# Patient Record
Sex: Female | Born: 1945 | Race: White | Hispanic: No | Marital: Married | Smoking: Never smoker
Health system: Southern US, Community
[De-identification: ages and names within clinical notes are randomized; demographics above are authoritative.]

## PROBLEM LIST (undated history)

## (undated) DIAGNOSIS — N814 Uterovaginal prolapse, unspecified: Secondary | ICD-10-CM

## (undated) DIAGNOSIS — R011 Cardiac murmur, unspecified: Secondary | ICD-10-CM

## (undated) DIAGNOSIS — N200 Calculus of kidney: Secondary | ICD-10-CM

## (undated) DIAGNOSIS — B019 Varicella without complication: Secondary | ICD-10-CM

## (undated) DIAGNOSIS — H18513 Endothelial corneal dystrophy, bilateral: Secondary | ICD-10-CM

## (undated) DIAGNOSIS — J45909 Unspecified asthma, uncomplicated: Secondary | ICD-10-CM

## (undated) DIAGNOSIS — D649 Anemia, unspecified: Secondary | ICD-10-CM

## (undated) DIAGNOSIS — M810 Age-related osteoporosis without current pathological fracture: Secondary | ICD-10-CM

## (undated) DIAGNOSIS — T7840XA Allergy, unspecified, initial encounter: Secondary | ICD-10-CM

## (undated) DIAGNOSIS — N8110 Cystocele, unspecified: Secondary | ICD-10-CM

## (undated) HISTORY — DX: Cystocele, unspecified: N81.10

## (undated) HISTORY — DX: Unspecified asthma, uncomplicated: J45.909

## (undated) HISTORY — DX: Calculus of kidney: N20.0

## (undated) HISTORY — DX: Varicella without complication: B01.9

## (undated) HISTORY — DX: Uterovaginal prolapse, unspecified: N81.4

## (undated) HISTORY — DX: Age-related osteoporosis without current pathological fracture: M81.0

## (undated) HISTORY — DX: Anemia, unspecified: D64.9

## (undated) HISTORY — DX: Allergy, unspecified, initial encounter: T78.40XA

---
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
## 1968-01-11 HISTORY — PX: OTHER SURGICAL HISTORY: SHX169

## 2006-12-24 ENCOUNTER — Emergency Department (HOSPITAL_COMMUNITY): Admission: EM | Admit: 2006-12-24 | Discharge: 2006-12-24 | Payer: Self-pay | Admitting: Emergency Medicine

## 2007-01-11 DIAGNOSIS — N200 Calculus of kidney: Secondary | ICD-10-CM

## 2007-01-11 HISTORY — DX: Calculus of kidney: N20.0

## 2007-04-04 ENCOUNTER — Other Ambulatory Visit: Admission: RE | Admit: 2007-04-04 | Discharge: 2007-04-04 | Payer: Self-pay | Admitting: Family Medicine

## 2011-07-06 ENCOUNTER — Other Ambulatory Visit: Payer: Self-pay | Admitting: Family Medicine

## 2011-07-06 DIAGNOSIS — Z780 Asymptomatic menopausal state: Secondary | ICD-10-CM

## 2013-01-10 DIAGNOSIS — N8110 Cystocele, unspecified: Secondary | ICD-10-CM

## 2013-01-10 HISTORY — DX: Cystocele, unspecified: N81.10

## 2013-11-26 DIAGNOSIS — M810 Age-related osteoporosis without current pathological fracture: Secondary | ICD-10-CM

## 2013-11-26 HISTORY — DX: Age-related osteoporosis without current pathological fracture: M81.0

## 2014-02-18 ENCOUNTER — Ambulatory Visit: Payer: PPO | Attending: Family Medicine | Admitting: Physical Therapy

## 2014-02-18 DIAGNOSIS — N8189 Other female genital prolapse: Secondary | ICD-10-CM | POA: Insufficient documentation

## 2014-02-18 DIAGNOSIS — R1032 Left lower quadrant pain: Secondary | ICD-10-CM | POA: Diagnosis not present

## 2014-03-05 ENCOUNTER — Ambulatory Visit: Payer: PPO | Admitting: Physical Therapy

## 2014-03-12 ENCOUNTER — Ambulatory Visit: Payer: PPO | Attending: Family Medicine | Admitting: Physical Therapy

## 2014-03-12 ENCOUNTER — Encounter: Payer: Self-pay | Admitting: Physical Therapy

## 2014-03-12 DIAGNOSIS — R1032 Left lower quadrant pain: Secondary | ICD-10-CM | POA: Diagnosis not present

## 2014-03-12 DIAGNOSIS — M62838 Other muscle spasm: Secondary | ICD-10-CM

## 2014-03-12 DIAGNOSIS — M25552 Pain in left hip: Secondary | ICD-10-CM

## 2014-03-12 DIAGNOSIS — N8189 Other female genital prolapse: Secondary | ICD-10-CM | POA: Diagnosis present

## 2014-03-12 NOTE — Patient Instructions (Addendum)
Patient confirms identification and approves physical therapist to perform internal soft tissue work .  Physical therapist verbally instructed patient on not holding her breath while lifting or performing a hard task to reduce prolapse.  [facility] verbally [facility]  General Guidelines for Pelvic Floor Exercise  Challenge your muscles to do more than they are used to doing. The quality of the exercise is more important that the number you perform.  Avoid straining, holding your breath or using buttock or leg muscles while you exercise the pelvic floor muscles.  Count out loud and continue breathing to avoid straining.  Relax your body and breathe during your exercises.  Coordinate your breathing with your pelvic floor contraction by blowing out or exhaling while you contract your pelvic floor muscles.   Concentrate on activating both the sphincters and levator ani muscles of the pelvic floor with each exercise.  Position for the Exercises  Start lying down with your knees bent and supported with pillows.  Once you've gained awareness and can feel the contractions you may perform the exercises either sitting or standing.  For example, you can do them while driving, working on the computer, or waiting in lines.  Quick Contractions  Repeat this exercise 5 times.  Do the exercise3 times per day.  Rapidly contract your pelvic floor muscles and hold for 2 seconds relax for 2 seconds.  Try to do the contraction on breathing exhalation.  Endurance Contractions  Repeat this 5 times.  Do the exercise 5 times per day.  Pull your pelvic floor muscles up and in and hold for 5 seconds then relax for 10 seconds.  Count out loud while you are holding the contraction to make sure that you are breathing throughout the exercise and not straining.  Other Exercises/Instructions Perform in laying downPiriformis Stretch, Sitting   Sit, one ankle on opposite knee, same-side hand on  crossed knee. Push down on knee, keeping spine straight. Lean torso forward, with flat back, until tension is felt in hamstrings and gluteals of crossed-leg side. Hold _30__ seconds.  Repeat _2__ times per session. Do __2_ sessions per day.  Copyright  VHI. All rights reserved.     2007, [employer] Doc.37 Adductor Stretch - Supine   Lie on floor, knees bent, feet flat. Keeping feet together, lower knees toward floor until stretch felt at inner thighs. Repeat _2__ times. Do __2_ times per day. Hold 30 secondsBACK: Hip Flexor Stretch   Interlace fingers on top of right knee. Shift weight forward. Continue breathing normally and hold position for _15seconds. Repeat on other leg. Alternate sides _2__ times. Do 2___ times per day.  Copyright  VHI. All rights reserved.                                              Interlace fingers on top of right knee. Shift weight forward. Continue breathing normally and hold position for ___ breaths. Repeat on other leg. Alternate sides ___ times. Do ___ times per day.  Copyright  VHI. All rights reserved.    Copyright  VHI. All rights reserved.    Physical therapist instructed patient on where to get a vaginal massager so she can perform self soft tissue work at home.

## 2014-03-12 NOTE — Therapy (Signed)
[facility] [address] [place], [place], [address] Phone: [phone]   Fax:  [phone]  Physical Therapy Treatment  Patient Details  Name: Raven Buchanan MRN: [id number] Date of Birth: 1945-08-18 Referring Provider:  Cain SaupeFulp, Cammie, MD  Encounter Date: 03/12/2014      PT End of Session - 03/12/14 0955    Visit Number 2   Date for PT Re-Evaluation 04/15/14   PT Start Time 0855   PT Stop Time 0956   PT Time Calculation (min) 61 min   Activity Tolerance Patient tolerated treatment well   Behavior During Therapy [facility] for tasks assessed/performed      History reviewed. No pertinent past medical history.  History reviewed. No pertinent past surgical history.  There were no vitals taken for this visit.  Visit Diagnosis:  Pain in joint, pelvic region and thigh, left  Muscle spasm      Subjective Assessment - 03/12/14 0859    Symptoms Patient bladder is dropping. Pain in left quadrant. wakes up 3 times with sleeping   Limitations Other (comment)  increased vaginal pressure with activities.  difficutly  with dancing   Patient Stated Goals increase stamina and strength of the pelvic floor and left hip   Currently in Pain? Yes   Pain Score 8   laying in bed 8/10, daily activities 1/10   Pain Location Groin  left inner thigh, left psoas   Pain Orientation Left   Pain Descriptors / Indicators Sharp;Dull   Pain Type Chronic pain   Pain Onset More than a month ago   Pain Frequency Intermittent   Aggravating Factors  laying down at night   Pain Relieving Factors change position   Effect of Pain on Daily Activities difficulty laying down, not able to dance   Multiple Pain Sites No                  Pelvic Floor Special Questions - 03/12/14 0001    Exam Type Vaginal   Strength fair squeeze, definite lift   Strength # of reps 5   Strength # of seconds 5          [facility] Adult PT Treatment/Exercise -  03/12/14 0001    Manual Therapy   Manual Therapy Massage;Myofascial release;Internal Pelvic Floor   Myofascial Release to lower abdominal, psoas, iliotibial band,left part of diaphragm, left quadricep, left hip addutor   Internal Pelvic Floor left obturator internist, left levator ane                PT Education - 03/12/14 0955    Education provided Yes   Education Details pelvic floor exercise, stretches   Person(s) Educated Patient   Methods Explanation;Demonstration;Tactile cues;Verbal cues;Handout   Comprehension Verbalized understanding;Returned demonstration          PT Short Term Goals - 03/12/14 0855    PT SHORT TERM GOAL #1   Title be independent with initial HEP for pelvic floor contraction in supine   Time 4   Period Weeks   Status New   PT SHORT TERM GOAL #2   Title how to lay with pillow under hips to reduce pelvic pressure   Time 4   Period Weeks   Status New   PT SHORT TERM GOAL #3   Title how to perform a valsalva maneurver to decreased prolapse   Time 4   Period Weeks   Status New   PT SHORT TERM GOAL #4  Title Toileting technique to reduce strain on prolapse with bowel movement   Time 4   Period Weeks   Status New           PT Long Term Goals - 03/12/14 1610    PT LONG TERM GOAL #1   Title demonstrate and/or verbalize techniques to reduce the risk of re-injury to include info on posture, body mechanics, lifting.   Time 8   Period Weeks   Status New   PT LONG TERM GOAL #2   Title be independent with advanced HEP for pelvic floor strengthening   Time 8   Period Weeks   Status New   PT LONG TERM GOAL #3   Title Pelvic floor strength >/= 4/5 with urinary leakage decreased >/= 60%   Time 8   Period Weeks   Status New   PT LONG TERM GOAL #4   Title to return to her dancing due to prolapse reduced with pessary   Time 8   Period Weeks   Status New   PT LONG TERM GOAL #5   Title sleep through the night 3 nights out of the week    Time 8   Period Weeks   Status New   Additional Long Term Goals   Additional Long Term Goals Yes   PT LONG TERM GOAL #6   Title left lower abdominal pain reduced >/= 60% with hip flexion    Time 8   Period Weeks   Status New               Plan - 03/12/14 9604    Clinical Impression Statement Patient has trigger points in left pelvic floor muscle, left quads, left hip adductor, and left abdominal wall.  Patient    Pt will benefit from skilled therapeutic intervention in order to improve on the following deficits Impaired flexibility;Postural dysfunction;Decreased endurance;Decreased activity tolerance;Increased fascial restricitons;Pain;Increased muscle spasms;Decreased strength;Decreased mobility   Rehab Potential Good   Clinical Impairments Affecting Rehab Potential None   PT Frequency 1x / week   PT Duration 8 weeks   PT Treatment/Interventions Passive range of motion;Therapeutic exercise;Moist Heat;Therapeutic activities;Patient/family education;Manual techniques;Ultrasound;Biofeedback;Neuromuscular re-education;Functional mobility training;Electrical Stimulation;Cryotherapy   PT Next Visit Plan go over gym exercises, toileting technique, soft tissue work   PT Home Exercise Plan toileting technique, self soft tissue work to pelvic floor muscles   Consulted and Agree with Plan of Care --       Problem List There are no active problems to display for this patient.   Liesl Simons, PT 03/12/2014, 10:10 AM  [facility] [address], [address] [place], [place], [address] Phone: [phone]   Fax:  [phone]

## 2014-03-19 ENCOUNTER — Encounter: Payer: Self-pay | Admitting: Physical Therapy

## 2014-03-19 ENCOUNTER — Ambulatory Visit: Payer: PPO | Admitting: Physical Therapy

## 2014-03-19 DIAGNOSIS — M62838 Other muscle spasm: Secondary | ICD-10-CM

## 2014-03-19 DIAGNOSIS — N8189 Other female genital prolapse: Secondary | ICD-10-CM | POA: Diagnosis not present

## 2014-03-19 NOTE — Therapy (Signed)
[facility] [address], [address] [place], [place], [address] Phone: [phone]   Fax:  [phone]  Physical Therapy Treatment  Patient Details  Name: Raven Spieslaine M Buchanan MRN: [id number] Date of Birth: 07-27-1945 Referring Provider:  Cain SaupeFulp, Cammie, MD  Encounter Date: 03/19/2014      PT End of Session - 03/19/14 0949    Visit Number 3   Date for PT Re-Evaluation 04/15/14   PT Start Time 0845   PT Stop Time 0945   PT Time Calculation (min) 60 min   Activity Tolerance Patient tolerated treatment well   Behavior During Therapy [facility] for tasks assessed/performed      History reviewed. No pertinent past medical history.  History reviewed. No pertinent past surgical history.  There were no vitals taken for this visit.  Visit Diagnosis:  Muscle spasm      Subjective Assessment - 03/19/14 0905    Symptoms Patient reports pain is 70% better.  Patient feels the bladder is not dropping as much.    Limitations Other (comment)   Patient Stated Goals increase stamina and strength of the pelvic floor and left hip   Currently in Pain? 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  No   Is the patient reluctant to leave their home because  of a fear of falling?  No   Prior Function   Level of Independence Independent with basic ADLs   Observation/Other Assessments   Other Surveys  --  PFDI -20 4 points   Palpation   Palpation Pelvis in correct alignment                  [facility] Adult PT Treatment/Exercise - 03/19/14 0001    Manual Therapy   Manual Therapy Massage;Myofascial release   Myofascial Release to lower abdominal area, bladder, diaphragm, along the intestines, bilateral psoas                PT Education - 03/19/14 0935    Education provided Yes   Education Details toileting technique, abdominal massage, diaphragmatic breathing, dancing moves   Person(s) Educated Patient   Methods Explanation;Demonstration;Verbal cues;Handout   Comprehension Verbalized understanding;Returned demonstration          PT Short Term Goals - 03/19/14 [id number]    PT SHORT TERM GOAL #1   Title be independent with initial HEP for pelvic floor contraction in supine   Time 4   Period Weeks   Status Achieved   PT SHORT TERM GOAL #2   Title how to lay with pillow under hips to reduce pelvic  pressure   Period Weeks   Status Achieved   PT SHORT TERM GOAL #3   Title how to perform a valsalva maneurver to decreased prolapse   Time 4   Period Weeks   Status New   PT SHORT TERM GOAL #4   Title Toileting technique to reduce strain on prolapse with bowel movement   Time 4   Period Weeks   Status Achieved           PT Long Term Goals - 03/19/14 1191    PT LONG TERM GOAL #1   Title demonstrate and/or verbalize techniques to reduce the risk of re-injury to include info on posture, body mechanics, lifting.   Time 8   Period Weeks   Status On-going  continue to educated   PT LONG TERM GOAL #2   Title be independent with advanced HEP for pelvic floor strengthening   Time 8   Period Weeks   Status On-going  continue to learn exercises   PT LONG TERM GOAL #3   Title Pelvic floor strength >/= 4/5 with urinary  leakage decreased >/= 60%   Time 8   Period Weeks   Status On-going  still weak   PT LONG TERM GOAL #4   Title to return to her dancing due to prolapse reduced with pessary   Time 8   Period Weeks   Status On-going  will be dancing in several weeks   PT LONG TERM GOAL #5   Title sleep through the night 3 nights out of the week   Time 8   Period Weeks   Status On-going  continues to wake up   PT LONG TERM GOAL #6   Title left lower abdominal pain reduced >/= 60% with hip flexion    Time 8   Period Weeks   Status Achieved               Plan - 03/19/14 0949    Clinical Impression Statement Patient has less restrictions in the left diaphragm compared to last visit.  Restriction in the bladder region and bilateral psoas.  After therapy, patient felt more aligned.    Pt will benefit from skilled therapeutic intervention in order to improve on the following deficits Impaired flexibility;Postural dysfunction;Decreased endurance;Decreased activity tolerance;Increased fascial restricitons;Pain;Increased muscle spasms;Decreased strength;Decreased mobility   Rehab Potential Good   Clinical Impairments Affecting Rehab Potential None   PT Frequency 1x / week   PT Duration 8 weeks   PT Treatment/Interventions Passive range of motion;Therapeutic exercise;Moist Heat;Therapeutic activities;Patient/family education;Manual techniques;Ultrasound;Biofeedback;Neuromuscular re-education;Functional mobility training;Electrical Stimulation;Cryotherapy   PT Next Visit Plan review gym and dance exercises with pelvic floor contraction, soft tissue work, and instruction on how to use vaginal massager   PT Home Exercise Plan pelvic floor exercise in standing, lift with valsalva manuever   Recommended Other Services None   Consulted and Agree with Plan of Care Patient        Problem List There are no active problems to display for this patient.   Raven Buchanan, PT 03/19/2014, 9:57 AM  [facility] [address], [address] [place], [place], [address] Phone: [phone]   Fax:  [phone]

## 2014-03-19 NOTE — Patient Instructions (Addendum)
Physical therapist discussed with patient on what vaginal massager will work, what it looks like and where to purchase. Patient verbally understands.    Toileting Techniques for Bowel Movements (Defecation)  Using your belly (abdomen) and pelvic floor muscles to have a bowel movement is usually instinctive.  Sometimes people can have problems with these muscles and have to relearn proper defecation (emptying) techniques.  If you have weakness in your muscles, organs that are falling out, decreased sensation in your pelvis, or ignore your urge to go, you may find yourself straining to have a bowel movement.  You are straining if you are:  . holding your breath or taking in a huge gulp of air and holding it  . keeping your lips and jaw tensed and closed tightly . turning red in the face because of excessive pushing or forcing . developing or worsening your  hemorrhoids . getting faint while pushing . not emptying completely and have to defecate many times a day  If you are straining, you are actually making it harder for yourself to have a bowel movement.  Many people find they are pulling up with the pelvic floor muscles and closing off instead of opening the anus. Due to lack pelvic floor relaxation and coordination the abdominal muscles, one has to work harder to push the feces out.  Many people have never been taught how to defecate efficiently and effectively.  Notice what happens to your body when you are having a bowel movement.  While you are sitting on the toilet pay attention to the following areas: . Jaw and mouth position . Angle of your hips   . Whether your feet touch the ground or not . Arm placement . Spine position . Waist . Belly tension . Anus (opening of the anal canal)  An Evacuation/Defecation Plan   Here are the 4 basic points:  1. Lean forward enough for your elbows to rest on your knees 2. Support your feet on the floor or use a low stool if your feet don't touch  the floor  3. Push out your belly as if you have swallowed a beach ball-you should feel a widening of your waist 4. Open and relax your pelvic floor muscles, rather than tightening around the anus   The following conditions my require modifications to your toileting posture:  . If you have had surgery in the past that limits your back, hip, pelvic, knee or ankle flexibility . Constipation   Your healthcare practitioner may make the following additional suggestions and adjustments:  1) Sit on the toilet  a) Make sure your feet are supported. b) Notice your hip angle and spine position-most people find it effective to lean forward or raise their knees, which can help the muscles around the anus to relax  c) When you lean forward, place your forearms on your thighs for support  2) Relax suggestions a) Breath deeply in through your nose and out slowly through your mouth as if you are smelling the flowers and blowing out the candles. b) To become aware of how to relax your muscles, contracting and releasing muscles can be helpful.  Pull your pelvic floor muscles in tightly by using the image of holding back gas, or closing around the anus (visualize making a circle smaller) and lifting the anus up and in.  Then release the muscles and your anus should drop down and feel open. Repeat 5 times ending with the feeling of relaxation. c) Keep your pelvic floor  muscles relaxed; let your belly bulge out. d) The digestive tract starts at the mouth and ends at the anal opening, so be sure to relax both ends of the tube.  Place your tongue on the roof of your mouth with your teeth separated.  This helps relax your mouth and will help to relax the anus at the same time.  3) Empty (defecation) a) Keep your pelvic floor and sphincter relaxed, then bulge your anal muscles.  Make the anal opening wide.  b) Stick your belly out as if you have swallowed a beach ball. c) Make your belly wall hard using your belly  muscles while continuing to breathe. Doing this makes it easier to open your anus. d) Breath out and give a grunt (or try using other sounds such as ahhhh, shhhhh, ohhhh or grrrrrrr).  4) Finish a) As you finish your bowel movement, pull the pelvic floor muscles up and in.  This will leave your anus in the proper place rather than remaining pushed out and down. If you leave your anus pushed out and down, it will start to feel as though that is normal and give you incorrect signals about needing to have a bowel movement.   2007, [employer] Doc.23  About Abdominal Massage  Abdominal massage, also called external colon massage, is a self-treatment circular massage technique that can reduce and eliminate gas and ease constipation. The colon naturally contracts in waves in a clockwise direction starting from inside the right hip, moving up toward the ribs, across the belly, and down inside the left hip.  When you perform circular abdominal massage, you help stimulate your colon's normal wave pattern of movement called peristalsis.  It is most beneficial when done after eating.  Positioning You can practice abdominal massage with oil while lying down, or in the shower with soap.  Some people find that it is just as effective to do the massage through clothing while sitting or standing.  How to Massage Start by placing your finger tips or knuckles on your right side, just inside your hip bone.  . Make small circular movements while you move upward toward your rib cage.   . Once you reach the bottom right side of your rib cage, take your circular movements across to the left side of the bottom of your rib cage.  . Next, move downward until you reach the inside of your left hip bone.  This is the path your feces travel in your colon. . Continue to perform your abdominal massage in this pattern for 10 minutes each day.     You can apply as much pressure as is comfortable in your massage.   Start gently and build pressure as you continue to practice.  Notice any areas of pain as you massage; areas of slight pain may be relieved as you massage, but if you have areas of significant or intense pain, consult with your healthcare provider.  Other Considerations . General physical activity including bending and stretching can have a beneficial massage-like effect on the colon.  Deep breathing can also stimulate the colon because breathing deeply activates the same nervous system that supplies the colon.   . Abdominal massage should always be used in combination with a bowel-conscious diet that is high in the proper type of fiber for you, fluids (primarily water), and a regular exercise program.  Hook-Lying   Lie with hips and knees bent. Allow body's muscles to relax. Place hands on belly. Inhale slowly  and deeply for _3__ seconds, so hands move up. Hold 5 seconds. Then take _3__ seconds to exhale. Repeat _5__ times. Do _2__ times a day.   Copyright  VHI. All rights reserved.  Physical therapist instructed patient on hip abduction and extension in standing with pelvic floor exercise to assist her to return to dancing. Patient able to return demonstration correctly correctly.

## 2014-03-26 ENCOUNTER — Ambulatory Visit: Payer: PPO | Admitting: Physical Therapy

## 2014-03-26 ENCOUNTER — Encounter: Payer: Self-pay | Admitting: Physical Therapy

## 2014-03-26 DIAGNOSIS — M62838 Other muscle spasm: Secondary | ICD-10-CM

## 2014-03-26 DIAGNOSIS — N8189 Other female genital prolapse: Secondary | ICD-10-CM | POA: Diagnosis not present

## 2014-03-26 DIAGNOSIS — M25552 Pain in left hip: Secondary | ICD-10-CM

## 2014-03-26 NOTE — Therapy (Signed)
[facility] [address], [address] [place], [place], [address] Phone: [phone]   Fax:  [phone]  Physical Therapy Treatment  Patient Details  Name: Raven Buchanan MRN: [id number] Date of Birth: [birth year] Referring Provider:  Cain SaupeFulp, Cammie, MD  Encounter Date: 03/26/2014      PT End of Session - 03/26/14 0931    Visit Number 4   Date for PT Re-Evaluation 04/15/14   PT Start Time 0845   PT Stop Time 0950   PT Time Calculation (min) 65 min   Activity Tolerance --   Behavior During Therapy [facility] for tasks assessed/performed      History reviewed. No pertinent past medical history.  History reviewed. No pertinent past surgical history.  There were no vitals filed for this visit.  Visit Diagnosis:  Pain in joint, pelvic region and thigh, left  Muscle spasm      Subjective Assessment - 03/26/14 0858    Symptoms Patient is not wearing the pessary intermittently.   Bladder has not dropped outside the vagina. Balance is not as good when standing on left leg.    Limitations Other (comment)   Patient Stated Goals increase stamina and strength of the pelvic floor and left hip   Currently in Pain? Yes   Pain Score 3    Pain Location Groin   Pain Orientation Left   Pain Descriptors / Indicators Sharp   Pain Type Chronic pain   Pain Onset More than a month ago   Pain Frequency Intermittent   Aggravating Factors  laying flat, stand and ER left hip, vacuuming over carpet   Pain Relieving Factors change position   Effect of Pain on Daily Activities difficulty with laying down and dancing   Multiple Pain Sites No                       [facility] Adult PT Treatment/Exercise - 03/26/14 0001    Modalities   Modalities Ultrasound   Ultrasound   Ultrasound Location left lower abdominal   Ultrasound Parameters 100%, 8 min, 1 mHz, 1.2 w/cm2   Ultrasound Goals Pain   Manual Therapy   Manual Therapy  Massage;Myofascial release   Massage soft tissue work to left psoas and abdominals   Myofascial Release lower abdominals and obturator internits     Patient was educated on how to use pelvic floor massager in a reclined position with a mirror and gloves, and vaginal massager.  Patient understands how to perform independently and was able to return demonstration correctly.            PT Education - 03/26/14 0958    Education provided Yes   Education Details balance exercises   Person(s) Educated Patient   Methods Explanation;Demonstration;Handout   Comprehension Verbalized understanding;Returned demonstration          PT Short Term Goals - 03/26/14 0915    PT SHORT TERM GOAL #3   Title how to perform a valsalva maneurver to decreased prolapse   Time 4   Period Weeks   Status Achieved           PT Long Term Goals - 03/26/14 0915    PT LONG TERM GOAL #1   Title demonstrate and/or verbalize techniques to reduce the risk of re-injury to include info on posture, body mechanics, lifting.   Time 8   PT LONG TERM GOAL #2   Title be independent with advanced HEP for pelvic floor strengthening  Time 8   Period Weeks   Status On-going  continues to learn exercises   PT LONG TERM GOAL #3   Title Pelvic floor strength >/= 4/5 with urinary leakage decreased >/= 60%   Time 8   Period Weeks   Status On-going  urinary leakage improved 80%. Pessary 1 time ,.   PT LONG TERM GOAL #4   Title to return to her dancing due to prolapse reduced with pessary   Time 8   Period Weeks   Status On-going  pain with dancing   PT LONG TERM GOAL #5   Title sleep through the night 3 nights out of the week   Time 8   Period Weeks   Status Achieved  does not have to get up to go to the bathroom               Plan - 03/26/14 0959    Clinical Impression Statement Patient has tenderness located in left psoas.  Patient has difficulty with standing on left leg due to trunk and hip  weakness. Patient is able to perform self soft tissue work to pelvic floor using a pelvic floor massager.    Pt will benefit from skilled therapeutic intervention in order to improve on the following deficits Impaired flexibility;Postural dysfunction;Decreased endurance;Decreased activity tolerance;Increased fascial restricitons;Pain;Increased muscle spasms;Decreased strength;Decreased mobility   Rehab Potential Good   Clinical Impairments Affecting Rehab Potential None   PT Frequency 1x / week   PT Duration 8 weeks   PT Treatment/Interventions Passive range of motion;Therapeutic exercise;Moist Heat;Therapeutic activities;Patient/family education;Manual techniques;Ultrasound;Biofeedback;Neuromuscular re-education;Functional mobility training;Electrical Stimulation;Cryotherapy   PT Next Visit Plan ultrasound, soft tissue work, balance exercises   PT Home Exercise Plan single leg stance exercises   Recommended Other Services None   Consulted and Agree with Plan of Care Patient        Problem List There are no active problems to display for this patient.   Rolf Fells, PT 03/26/2014, 10:04 AM  [facility] [address] [place], [place], [address] Phone: [phone]   Fax:  [phone]

## 2014-03-26 NOTE — Patient Instructions (Addendum)
Pelvic floor massage    Put lubricant on vaginal massager.  Place inside vaginal directed toward the hip.  Rub the massager side for 3 minutes.  If you find a tender point hold for 1 min.  Then do other side of pelvic floor for 3 minutes.  Perform daily.   Swing: Anterior / Posterior   Hold onto counter. Standing on left leg, swing other leg forward and backward, under control, _10___ times. Repeat with other leg. Do _2___ sets per session. Do __3__ sessions per week.  http://bt.exer.us/60   Copyright  VHI. All rights reserved.  Hip Flexion With Rotation   Hold counter. Standing on one leg, other thigh parallel to floor, slowly rotate raised leg from side to side. Hold each position _1___ seconds. Repeat on other leg. Do __10__ repetitions, _1___ sets.  http://bt.exer.us/42   Copyright  VHI. All rights reserved.  Without Support   Stand on one leg in neutral spine without support. Hold _15___ seconds. Repeat on other leg. Do _3___ repetitions, __1__ sets. Then close eyes hold 15 sec. 3 times.  Then on other left.   http://bt.exer.us/36   Copyright  VHI. All rights reserved.  Patient able to return demonstration on above exercises

## 2014-04-02 ENCOUNTER — Ambulatory Visit: Payer: PPO | Admitting: Physical Therapy

## 2014-04-02 DIAGNOSIS — M25552 Pain in left hip: Secondary | ICD-10-CM

## 2014-04-02 DIAGNOSIS — N8189 Other female genital prolapse: Secondary | ICD-10-CM | POA: Diagnosis not present

## 2014-04-02 DIAGNOSIS — M62838 Other muscle spasm: Secondary | ICD-10-CM

## 2014-04-02 NOTE — Patient Instructions (Addendum)
Patient was instructed to wear her pessary during dancing.  Bracing With Forward Lunge (Standing)   Stand with hands on hips. Find neutral spine. Tighten pelvic floor and abdominals and hold. Step forward and bend knee to lower trunk.Hold 5 sec.  Repeat _3__ times. Do __1_ times a day. Perform both ways.  Copyright  VHI. All rights reserved.  Walk forward with blue band around waist and attached to door 5 x the backwards 5 x.  Do not let hips drop and pull up pelvic floor. 1 time per day.   When performing pelvic floor exercises use the vaginal massager to contract around and hold 5 seconds with correct breathing.  Patient able to perform the above exercises correctly.

## 2014-04-02 NOTE — Therapy (Signed)
[facility] [address], [address] [place], [place], [address] Phone: [phone]   Fax:  [phone]  Physical Therapy Treatment  Patient Details  Name: Raven Buchanan M Dolley MRN: [id number] Date of Birth: 1945-06-18 Referring Provider:  Cain SaupeFulp, Cammie, MD  Encounter Date: 04/02/2014      PT End of Session - 04/02/14 0928    Visit Number 5   Date for PT Re-Evaluation 06/10/14   PT Start Time 0845   PT Stop Time 0930   PT Time Calculation (min) 45 min   Activity Tolerance Patient tolerated treatment well   Behavior During Therapy [facility] for tasks assessed/performed      No past medical history on file.  No past surgical history on file.  There were no vitals filed for this visit.  Visit Diagnosis:  Pain in joint, pelvic region and thigh, left - Plan: PT plan of care cert/re-cert  Muscle spasm - Plan: PT plan of care cert/re-cert      Subjective Assessment - 04/02/14 0850    Symptoms When i do abdominal exercises, I get pulling in left side. Pulling is 50% better. I feel my balance is better. Somedays the pessary slides in and other days I have difficulty.  Did not feel the bladder be irritated without the pessary. compared to 10AM.    Limitations Other (comment)   How long can you sit comfortably? No difficulty    How long can you stand comfortably? In afternoon when she stands when feels the bladder dropping.    How long can you walk comfortably? In the afternoon when she walks for the bladder dropping.    Patient Stated Goals increase stamina and strength of the pelvic floor and left hip   Pain Score 4    Pain Location Groin   Pain Orientation Left   Pain Descriptors / Indicators Dull;Sharp   Pain Type Chronic pain   Pain Onset More than a month ago   Pain Frequency Intermittent   Aggravating Factors  lay flat wtih hip flexion.   Pain Relieving Factors change position   Effect of Pain on Daily Activities dancing    Multiple Pain Sites No                    Pelvic Floor Special Questions - 04/02/14 0001    Pelvic Floor Internal Exam Pt. confirmed identification and approved therapist to assess strength   Exam Type Vaginal   Strength --  4-/5 with verbal cue to breathe and lift pelvic floor   Strength # of reps 4   Strength # of seconds 3   Tone improved tone of pelvic floor       Patient is able to abolish her left groin pain when not dropping her left hip while walking with resistance due to working her core. Patient requires verbal cues to lift her pelvic floor correctly with breathing.             PT Education - 04/02/14 0913    Education provided Yes   Education Details walk with resistance, lunge   Person(s) Educated Patient   Methods Explanation;Demonstration;Verbal cues   Comprehension Returned demonstration;Verbalized understanding          PT Short Term Goals - 03/26/14 0915    PT SHORT TERM GOAL #3   Title how to perform a valsalva maneurver to decreased prolapse   Time 4   Period Weeks   Status Achieved  PT Long Term Goals - 04/02/14 0855    PT LONG TERM GOAL #1   Title demonstrate and/or verbalize techniques to reduce the risk of re-injury to include info on posture, body mechanics, lifting.   Time 8   Period Weeks   Status Achieved   PT LONG TERM GOAL #2   Title be independent with advanced HEP for pelvic floor strengthening   Time 8   Period Weeks   Status On-going  continues to progress exercises   PT LONG TERM GOAL #4   Title to return to her dancing due to prolapse reduced with pessary   Time 8   Period Weeks   Status On-going  has not fully returned to dancing   PT LONG TERM GOAL #5   Title sleep through the night 3 nights out of the week   Time 8   Period Weeks   Status Achieved   PT LONG TERM GOAL #6   Title left lower abdominal pain reduced >/= 60% with hip flexion    Time 8   Period Weeks   Status Achieved                Plan - 04/02/14 8119    Clinical Impression Statement Patient continues to have tenderness located in left psoas area.  Pelvic floor strength has increased.  When resistive walking with have a trendelenberg on the right. Patient reports no urinary leakage.    Pt will benefit from skilled therapeutic intervention in order to improve on the following deficits Impaired flexibility;Postural dysfunction;Decreased endurance;Decreased activity tolerance;Increased fascial restricitons;Pain;Increased muscle spasms;Decreased strength;Decreased mobility   Rehab Potential Good   Clinical Impairments Affecting Rehab Potential None   PT Frequency 1x / week   PT Duration 8 weeks   PT Treatment/Interventions Passive range of motion;Therapeutic exercise;Moist Heat;Therapeutic activities;Patient/family education;Manual techniques;Ultrasound;Biofeedback;Neuromuscular re-education;Functional mobility training;Electrical Stimulation;Cryotherapy   PT Next Visit Plan ultrasound, soft tissue work, see how dancing went.   PT Home Exercise Plan review HEP   Recommended Other Services None   Consulted and Agree with Plan of Care Patient        Problem List There are no active problems to display for this patient.   Kaige Whistler,PT 04/02/2014, 9:34 AM  [facility] [address], [address] [place], [place], [address] Phone: [phone]   Fax:  [phone]

## 2014-04-14 ENCOUNTER — Encounter: Payer: Self-pay | Admitting: Physical Therapy

## 2014-04-14 ENCOUNTER — Ambulatory Visit: Payer: PPO | Attending: Family Medicine | Admitting: Physical Therapy

## 2014-04-14 DIAGNOSIS — R1032 Left lower quadrant pain: Secondary | ICD-10-CM | POA: Diagnosis not present

## 2014-04-14 DIAGNOSIS — N8189 Other female genital prolapse: Secondary | ICD-10-CM | POA: Diagnosis present

## 2014-04-14 DIAGNOSIS — M25552 Pain in left hip: Secondary | ICD-10-CM

## 2014-04-14 DIAGNOSIS — M62838 Other muscle spasm: Secondary | ICD-10-CM

## 2014-04-15 NOTE — Therapy (Signed)
[facility] [address] [place], [place], [address] Phone: [phone]   Fax:  [phone]  Physical Therapy Treatment  Patient Details  Name: Raven Buchanan MRN: [id number] Date of Birth: [birth year] Referring Provider:  Cain Saupe, MD  Encounter Date: 04/14/2014      PT End of Session - 04/14/14 1634    Visit Number 6   Date for PT Re-Evaluation 06/10/14   PT Start Time 1630   PT Stop Time 1700   PT Time Calculation (min) 30 min   Activity Tolerance Patient tolerated treatment well   Behavior During Therapy [facility] for tasks assessed/performed      History reviewed. No pertinent past medical history.  History reviewed. No pertinent past surgical history.  There were no vitals filed for this visit.  Visit Diagnosis:  Pain in joint, pelvic region and thigh, left  Muscle spasm      Subjective Assessment - 04/14/14 1628    Subjective When I lay with my legs straight it is harder to contract pelvic floor then when my legs are bent. When patient contracts pelvic floor and lower abdomen she has less pain. Patient reports it takes more hours for the bladder to dropping and does not come out of the introitus.  Patient reports left sided pain has decresaed by 50%.    Limitations Other (comment)   How long can you sit comfortably? No difficulty    How long can you stand comfortably? In afternoon when she stands when feels the bladder dropping.    How long can you walk comfortably? In the afternoon when she walks for the bladder dropping.    Patient Stated Goals increase stamina and strength of the pelvic floor and left hip   Currently in Pain? Yes   Pain Score 2    Pain Location Groin   Pain Orientation Left   Pain Descriptors / Indicators Tightness   Pain Type Chronic pain   Pain Onset More than a month ago   Pain Frequency Intermittent   Aggravating Factors  Not sure   Pain Relieving Factors massage with  vibrating   Multiple Pain Sites No                       [facility] Adult PT Treatment/Exercise - 04/14/14 0001    Modalities   Modalities Ultrasound   Ultrasound   Ultrasound Location left lower abdomen   Ultrasound Parameters 100%, 8 min, 1.2w/cm2, 1 mhz   Ultrasound Goals Pain   Manual Therapy   Manual Therapy Massage;Myofascial release   Massage soft tissue work to left psoas and abdominals   Myofascial Release lower abdominals and obturator internits                  PT Short Term Goals - 03/26/14 0915    PT SHORT TERM GOAL #3   Title how to perform a valsalva maneurver to decreased prolapse   Time 4   Period Weeks   Status Achieved           PT Long Term Goals - 04/14/14 1635    PT LONG TERM GOAL #2   Title be independent with advanced HEP for pelvic floor strengthening   Time 8   Period Weeks   Status On-going   PT LONG TERM GOAL #3   Title Pelvic floor strength >/= 4/5 with urinary leakage decreased >/= 60%   Time 8   Period Weeks  Status On-going   PT LONG TERM GOAL #4   Title to return to her dancing due to prolapse reduced with pessary   Time 8   Period Weeks   Status On-going  not jumping with dance yet               Plan - 04/14/14 1655    Clinical Impression Statement Patient has tender  areas in left iliopsoas.  Patient has better balance with dance moves.  Patient is getting stronger and understands ways to accommodate to her pain.    Pt will benefit from skilled therapeutic intervention in order to improve on the following deficits Impaired flexibility;Postural dysfunction;Decreased endurance;Decreased activity tolerance;Increased fascial restricitons;Pain;Increased muscle spasms;Decreased strength;Decreased mobility   Rehab Potential Good   PT Frequency 1x / week   PT Duration 2 weeks   PT Treatment/Interventions Passive range of motion;Therapeutic exercise;Moist Heat;Therapeutic activities;Patient/family  education;Manual techniques;Ultrasound;Biofeedback;Neuromuscular re-education;Functional mobility training;Electrical Stimulation;Cryotherapy   PT Next Visit Plan jumping on trampoline, ultrasound, soft tissue work   PT Home Exercise Plan review HEP   Consulted and Agree with Plan of Care Patient        Problem List There are no active problems to display for this patient.   GRAY,CHERYL,PT 04/15/2014, 7:53 AM  [facility] [address], [address] [place], [place], [address] Phone: [phone]   Fax:  [phone]

## 2014-04-23 ENCOUNTER — Encounter: Payer: Self-pay | Admitting: Physical Therapy

## 2014-04-23 ENCOUNTER — Ambulatory Visit: Payer: PPO | Admitting: Physical Therapy

## 2014-04-23 DIAGNOSIS — N8189 Other female genital prolapse: Secondary | ICD-10-CM | POA: Diagnosis not present

## 2014-04-23 DIAGNOSIS — M62838 Other muscle spasm: Secondary | ICD-10-CM

## 2014-04-23 NOTE — Therapy (Signed)
[facility] [address], [address] [place], [place], [address] Phone: [phone]   Fax:  [phone]  Physical Therapy Treatment  Patient Details  Name: Raven Buchanan MRN: [id number] Date of Birth: 03/27/1945 Referring Provider:  Cain SaupeFulp, Cammie, MD  Encounter Date: 04/23/2014      PT End of Session - 04/23/14 0936    Visit Number 7   Number of Visits 10  Medicare   Date for PT Re-Evaluation 06/10/14   PT Start Time 0930   PT Stop Time 1015   PT Time Calculation (min) 45 min   Activity Tolerance Patient tolerated treatment well   Behavior During Therapy [facility] for tasks assessed/performed      History reviewed. No pertinent past medical history.  History reviewed. No pertinent past surgical history.  There were no vitals filed for this visit.  Visit Diagnosis:  Muscle spasm      Subjective Assessment - 04/23/14 0937    Subjective The left side pain is better. When I massage internally the right side feels tighter than the left.  No change in prolapse.  Patient reports her balance is better. No leakage with pessary now.    Limitations Other (comment)   How long can you sit comfortably? No difficulty    How long can you stand comfortably? In afternoon when she stands when feels the bladder dropping.    How long can you walk comfortably? In the afternoon when she walks for the bladder dropping.    Patient Stated Goals increase stamina and strength of the pelvic floor and left hip   Currently in Pain? Yes   Pain Score 2    Pain Location Groin   Pain Orientation Left   Pain Descriptors / Indicators Tightness   Pain Type Chronic pain   Pain Onset More than a month ago   Pain Frequency Intermittent   Aggravating Factors  Not sure   Pain Relieving Factors massage with vibrating   Effect of Pain on Daily Activities dancing   Multiple Pain Sites No                       [facility] Adult PT Treatment/Exercise -  04/23/14 0001    Exercises   Exercises --  trampoline with pelvic floor contraction   Ultrasound   Ultrasound Location left lower abdomen   Ultrasound Parameters 100%, 8 min. 1 mhz, 1.2 w/cm2   Ultrasound Goals Pain   Manual Therapy   Manual Therapy Massage;Myofascial release   Massage soft tissue work to left psoas and abdominals   Myofascial Release lower abdominals and obturator internits                PT Education - 04/23/14 1011    Education provided Yes   Education Details yoga poses, reviewed past HEP   Methods Explanation;Demonstration;Handout   Comprehension Verbalized understanding;Returned demonstration          PT Short Term Goals - 03/26/14 0915    PT SHORT TERM GOAL #3   Title how to perform a valsalva maneurver to decreased prolapse   Time 4   Period Weeks   Status Achieved           PT Long Term Goals - 04/23/14 1013    PT LONG TERM GOAL #2   Title be independent with advanced HEP for pelvic floor strengthening   Time 8   Period Weeks   Status On-going  still learning HEP  PT LONG TERM GOAL #3   Title Pelvic floor strength >/= 4/5 with urinary leakage decreased >/= 60%   Time 8   Period Weeks   Status On-going   PT LONG TERM GOAL #4   Title to return to her dancing due to prolapse reduced with pessary   Time 8   Period Weeks   Status Achieved               Plan - 04/23/14 1012    Clinical Impression Statement Patient continues to have tenderness located in left psoas.  Patient is independent with HEP.  Patient able to jump on the trampoline without urinary leakage and was using her pessary.    Pt will benefit from skilled therapeutic intervention in order to improve on the following deficits Impaired flexibility;Postural dysfunction;Decreased endurance;Decreased activity tolerance;Increased fascial restricitons;Pain;Increased muscle spasms;Decreased strength;Decreased mobility   Rehab Potential Good   Clinical Impairments  Affecting Rehab Potential None   PT Frequency 1x / week   PT Duration 2 weeks   PT Treatment/Interventions Passive range of motion;Therapeutic exercise;Moist Heat;Therapeutic activities;Patient/family education;Manual techniques;Ultrasound;Biofeedback;Neuromuscular re-education;Functional mobility training;Electrical Stimulation;Cryotherapy   PT Next Visit Plan ultrasound, soft tissue work   PT Home Exercise Plan current HEP   Consulted and Agree with Plan of Care Patient        Problem List There are no active problems to display for this patient.   Ellianne Gowen,PT 04/23/2014, 10:16 AM  [facility] [address] [place], [place], [address] Phone: [phone]   Fax:  [phone]

## 2014-04-23 NOTE — Patient Instructions (Signed)
Warrior II   In wide stance, arms extended out, rotate right leg out 90, left leg in 20. Bend right leg 90 in line with foot. Keep left foot flat, hips square to front. Turn head right. Hold for ____ breaths. Repeat on other side. NOTE: Keep bent knee behind line of toes.  Lift both arm overhead and look upward. Hold 15 seconds. Then place palms together with elbows up and rotate to the right.   http://yg.exer.us/16   Copyright  VHI. All rights reserved.  Lunge Stretch   Step into deep forward lunge, hands on thigh, knee lightly touching floor. Push back leg straight. Do not allow front knee past line of toes. Hold for _15___ breaths. Repeat on other side. ADVANCED: Arms reaching up, arch back slightly.  Copyright  VHI. All rights reserved.   Patient able to return demonstration correctly

## 2014-04-30 ENCOUNTER — Encounter: Payer: Self-pay | Admitting: Physical Therapy

## 2014-04-30 ENCOUNTER — Ambulatory Visit: Payer: PPO | Admitting: Physical Therapy

## 2014-04-30 DIAGNOSIS — M62838 Other muscle spasm: Secondary | ICD-10-CM

## 2014-04-30 DIAGNOSIS — M25552 Pain in left hip: Secondary | ICD-10-CM

## 2014-04-30 DIAGNOSIS — N8189 Other female genital prolapse: Secondary | ICD-10-CM | POA: Diagnosis not present

## 2014-04-30 NOTE — Therapy (Signed)
[facility] [address] [place], [place], [address] Phone: [phone]   Fax:  [phone]  Physical Therapy Treatment  Patient Details  Name: Raven Buchanan MRN: [id number] Date of Birth: 01-15-1945 Referring Provider:  Cain Saupe, MD  Encounter Date: 04/30/2014      PT End of Session - 04/30/14 1015    Visit Number 8   Number of Visits 10  Medicare   Date for PT Re-Evaluation 06/10/14   PT Start Time 0930   PT Stop Time 1025   PT Time Calculation (min) 55 min   Activity Tolerance Patient tolerated treatment well   Behavior During Therapy [facility] for tasks assessed/performed      History reviewed. No pertinent past medical history.  History reviewed. No pertinent past surgical history.  There were no vitals filed for this visit.  Visit Diagnosis:  Muscle spasm  Pain in joint, pelvic region and thigh, left      Subjective Assessment - 04/30/14 0937    Subjective The left sided pain is doing better.  One time when I pulled up my stomach muscle I had a little cramp. Patient reports cramping in severity has decreased by 50%.  Patient reports the frequency has decreased by 90%.    How long can you sit comfortably? No difficulty    How long can you stand comfortably? In afternoon when she stands when feels the bladder dropping.    How long can you walk comfortably? In the afternoon when she walks for the bladder dropping.    Patient Stated Goals increase stamina and strength of the pelvic floor and left hip   Currently in Pain? Yes   Pain Score 2   yesterday 8/10   Pain Location Groin   Pain Orientation Left   Pain Descriptors / Indicators Sore;Tightness   Pain Type Chronic pain   Pain Onset More than a month ago   Pain Frequency Intermittent   Pain Relieving Factors massage with vibration   Effect of Pain on Daily Activities dancing   Multiple Pain Sites No     PT reviewed with patient on her  stretches and gave verbal cues to increase the effectiveness of the stretch by engaging the core, how to adjust the yoga pose to further stretch the hip and trunk, how to use breath to further stretch the muscle.  How to protect her left knee with warrior pose.                  Pelvic Floor Special Questions - 04/30/14 0001    Strength --  4-/5 with verbal cue to breathe and lift pelvic floor   Strength # of reps 4   Strength # of seconds 3           [facility] Adult PT Treatment/Exercise - 04/30/14 0001    Manual Therapy   Manual Therapy Internal Pelvic Floor   Massage soft tissue work to bil levator ani, piriformis, obturator internist with different hip positions                PT Education - 04/30/14 1022    Education provided Yes   Education Details yoga poses, pelvic floor contraction in different positions, quadruped lift one extremity   Person(s) Educated Patient   Methods Explanation;Demonstration;Tactile cues;Verbal cues;Handout   Comprehension Verbalized understanding;Returned demonstration          PT Short Term Goals - 03/26/14 0915    PT SHORT TERM GOAL #  3   Title how to perform a valsalva maneurver to decreased prolapse   Time 4   Period Weeks   Status Achieved           PT Long Term Goals - 04/30/14 1014    PT LONG TERM GOAL #2   Title be independent with advanced HEP for pelvic floor strengthening   Time 8   Period Weeks   Status On-going  still learning how to progress herself   PT LONG TERM GOAL #3   Title Pelvic floor strength >/= 4/5 with urinary leakage decreased >/= 60%   Time 8   Period Weeks   Status On-going  4-/5               Plan - 04/30/14 1026    Clinical Impression Statement Patient understands how to correctly perform her stretches, how to engage the pelvic floor with lower abdominals.  Tenderness located in the pelvic floor on the right and left side.    Pt will benefit from skilled therapeutic  intervention in order to improve on the following deficits Impaired flexibility;Postural dysfunction;Decreased endurance;Decreased activity tolerance;Increased fascial restricitons;Pain;Increased muscle spasms;Decreased strength;Decreased mobility   Rehab Potential Good   Clinical Impairments Affecting Rehab Potential None   PT Frequency 1x / week   PT Duration 4 weeks   PT Treatment/Interventions Passive range of motion;Therapeutic exercise;Moist Heat;Therapeutic activities;Patient/family education;Manual techniques;Ultrasound;Biofeedback;Neuromuscular re-education;Functional mobility training;Electrical Stimulation;Cryotherapy   PT Next Visit Plan ultrasound, soft tissue work; Reassess patient due to being out of the country [place] 2 weeks.    PT Home Exercise Plan current HEP   Consulted and Agree with Plan of Care Patient        Problem List There are no active problems to display for this patient.   GRAY,CHERYL,PT 04/30/2014, 10:31 AM  [facility] [address] [place], [place], [id number] Phone: [phone]   Fax:  [phone]

## 2014-04-30 NOTE — Patient Instructions (Signed)
Hip Extension (All-Fours)   Lift right leg back with knee slightly flexed. Do not arch neck or back. Pull up pelvic floor.  Hold 10 sec.  Repeat _10___ times per set. Do __1__ sets per session. Do __1__ sessions per day.  http://orth.exer.us/106   Copyright  VHI. All rights reserved.  Upper Body Extension (All-Fours)   Raise right arm in front. Do not arch neck. Be sure to keep back flat. Pull up pelvic floor. Hold 10 sec.  Repeat _10___ times per set. Do _1___ sets per session. Do __1__ sessions per day.  http://orth.exer.us/108   Copyright  VHI. All rights reserved.     Slow Contraction: Gravity Eliminated (Side-Lying)   Lie on left side, hips and knees slightly bent. Slowly squeeze pelvic floor for _5__ seconds. Rest for _5_ seconds. Repeat _10__ times. Do _1__ times a day.   Copyright  VHI. All rights reserved.  Slow Contraction: Gravity Resisted (Quadruped)   On hands and knees, slowly squeeze pelvic floor for _5__ seconds. Rest for _5__ seconds. Repeat _10__ times. Do 1___ times a day.   Copyright  VHI. All rights reserved.  Patient able to return demonstration correctly

## 2014-05-07 ENCOUNTER — Encounter: Payer: PPO | Admitting: Physical Therapy

## 2014-05-21 ENCOUNTER — Ambulatory Visit: Payer: PPO | Admitting: Physical Therapy

## 2014-05-28 ENCOUNTER — Ambulatory Visit: Payer: PPO | Admitting: Physical Therapy

## 2014-06-04 ENCOUNTER — Ambulatory Visit: Payer: PPO | Attending: Family Medicine | Admitting: Physical Therapy

## 2014-06-04 ENCOUNTER — Encounter: Payer: Self-pay | Admitting: Physical Therapy

## 2014-06-04 DIAGNOSIS — M62838 Other muscle spasm: Secondary | ICD-10-CM

## 2014-06-04 DIAGNOSIS — R1032 Left lower quadrant pain: Secondary | ICD-10-CM | POA: Insufficient documentation

## 2014-06-04 DIAGNOSIS — N8189 Other female genital prolapse: Secondary | ICD-10-CM | POA: Insufficient documentation

## 2014-06-04 DIAGNOSIS — M25552 Pain in left hip: Secondary | ICD-10-CM

## 2014-06-04 NOTE — Patient Instructions (Signed)
Bracing With Leg March (Hook-Lying)   With neutral spine, tighten pelvic floor and abdominals and hold. Alternating legs, lift foot _6__ inches and return to floor. Repeat _10__ times. Do __1_ times a day.   Copyright  VHI. All rights reserved.

## 2014-06-04 NOTE — Therapy (Signed)
[facility] [address] [place], [place], [address] Phone: [phone]   Fax:  [phone]  Physical Therapy Treatment  Patient Details  Name: Raven Buchanan MRN: [id number] Date of Birth: [birth year] Referring Provider:  Antony Blackbird, MD  Encounter Date: 06/04/2014      PT End of Session - 06/04/14 0942    Visit Number 9   Number of Visits 10  Medicare   Date for PT Re-Evaluation 06/10/14   PT Start Time 0942   PT Stop Time 1015   PT Time Calculation (min) 33 min   Activity Tolerance Patient tolerated treatment well   Behavior During Therapy [name] for tasks assessed/performed      History reviewed. No pertinent past medical history.  History reviewed. No pertinent past surgical history.  There were no vitals filed for this visit.  Visit Diagnosis:  Muscle spasm  Pain in joint, pelvic region and thigh, left      Subjective Assessment - 06/04/14 0942    Subjective Patient reports the left sided pain is doing better. The past week I am getting back to the routine of exercies.  I skipped my exercises for 3 weeks and noticed some discomfort. My pelvic floor is getting stronger.  I can breath with the pelvic floor exercise now.  Yesterday I did not wear the pessary and did not ffeel the need to place it in. I jumped on the trampoline and felt the pressure. Patient has no urinary leakage.    Limitations Other (comment)   How long can you sit comfortably? No difficulty    Patient Stated Goals increase stamina and strength of the pelvic floor and left hip   Currently in Pain? 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 confirmed identification and approved therapist to assess strength   Exam Type Vaginal   Strength good squeeze, good lift, able to hold agaisnt strong resistance   Strength # of reps 4   Strength # of seconds 3           [facility] Adult PT Treatment/Exercise - 06/04/14 0001    Balance Poses: Yoga   Warrior I 1 rep;30 seconds   Warrior II 1 rep   Lumbar Exercises: Hydrologist --  hip adductors sitting legs apart   Lower Trunk Rotation 1 rep;30 seconds   Hip Flexor Stretch 1 rep;30 seconds  lunge with bil. arms flexed   Standing Side Bend 2 reps;10 seconds  sidely/sideplank stretch   Piriformis Stretch 1 rep;30 seconds   Lumbar Exercises: Standing  Other Standing Lumbar Exercises stand on one leg with hip ER, SLS with eyes open and closed; SLR for hip flexion ; jumping on trampoline 30 jumps   Lumbar Exercises: Seated   Other Seated Lumbar Exercises sitting butterfly stretch   Lumbar Exercises: Supine   Bent Knee Raise 10 reps   Lumbar Exercises: Quadruped   Single Arm Raise 5 reps   Straight Leg Raise 5 reps                PT Education - June 13, 2014 1008    Education provided Yes   Education Details hookly  with marching   Person(s) Educated Patient   Methods Explanation;Demonstration;Verbal cues;Handout   Comprehension Returned demonstration;Verbalized understanding          PT Short Term Goals - 03/26/14 0915    PT SHORT TERM GOAL #3   Title how to perform a valsalva maneurver to decreased prolapse   Time 4   Period Weeks   Status Achieved            PT Long Term Goals - 06/13/2014 0950    PT LONG TERM GOAL #2   Title be independent with advanced HEP for pelvic floor strengthening   Time 8   Period Weeks   Status Achieved   PT LONG TERM GOAL #3   Title Pelvic floor strength >/= 4/5 with urinary leakage decreased >/= 60%   Time 8   Period Weeks   Status Achieved               Plan - 06/13/14 1201    Clinical Impression Statement Patient has met all of her goals. Patient with HEP.  Patient has increased pelvic floor strength to 4/5. Left lower quadrant pain is a small twing and will increase when she takes 3 weeks off  exercise. Patient reports no urinary leakage.  Patient reports she can go wiithout a pessary  and not have a pressure feeling until she jumps on the trampoline.  Patient has returned to her dancing and all activities.  Patient G-code has improved to CI.    Pt will benefit from skilled therapeutic intervention in order to improve on the following deficits Impaired flexibility;Postural dysfunction;Decreased endurance;Decreased activity tolerance;Increased fascial restricitons;Pain;Increased muscle spasms;Decreased strength;Decreased mobility   Rehab Potential Good   Clinical Impairments Affecting Rehab Potential None   PT Next Visit Plan D/C to HEP   PT Home Exercise Plan work on [employer] and Agree with Plan of Care Patient          G-Codes - 06/13/2014 1238    Functional Assessment Tool Used PFDI score is 2 points   Functional Limitation Other PT primary   Other PT Primary Goal Status (Z6109) At least 1 percent but less than 20 percent impaired, limited or restricted   Other PT Primary Discharge Status (U0454) At least 1 percent but less than 20 percent impaired, limited or restricted      Problem List There are no active problems to display for this patient.   GRAY,CHERYL,PT 06-13-14, 1:27 PM  [facility] [address] [place], [place], [address] Phone: [phone]   Fax:  [phone] PHYSICAL THERAPY DISCHARGE SUMMARY  Visits from Start of Care: 9 Current functional level related to goals / functional outcomes: See above goals.  Patient has met all of her goals.    Remaining deficits: If patient does not continue to perform her stretches she will have tightness  in left psoas.    Education / Equipment: HEP  Plan: Patient agrees to discharge.  Patient goals were met. Patient is being discharged due to meeting the stated rehab goals.  Thank you for the referral. Earlie Counts, PT 06/04/2014 1:27 PM ?????

## 2015-04-07 ENCOUNTER — Encounter: Payer: Self-pay | Admitting: Family Medicine

## 2015-04-07 ENCOUNTER — Ambulatory Visit (INDEPENDENT_AMBULATORY_CARE_PROVIDER_SITE_OTHER): Payer: PPO | Admitting: Family Medicine

## 2015-04-07 VITALS — BP 116/77 | HR 74 | Temp 98.4°F | Resp 20 | Ht 64.0 in | Wt 138.0 lb

## 2015-04-07 DIAGNOSIS — Z2821 Immunization not carried out because of patient refusal: Secondary | ICD-10-CM | POA: Diagnosis not present

## 2015-04-07 DIAGNOSIS — Z2820 Immunization not carried out because of patient decision for unspecified reason: Secondary | ICD-10-CM

## 2015-04-07 DIAGNOSIS — Z7189 Other specified counseling: Secondary | ICD-10-CM

## 2015-04-07 DIAGNOSIS — Z7689 Persons encountering health services in other specified circumstances: Secondary | ICD-10-CM

## 2015-04-07 DIAGNOSIS — Z5320 Procedure and treatment not carried out because of patient's decision for unspecified reasons: Secondary | ICD-10-CM | POA: Diagnosis not present

## 2015-04-07 DIAGNOSIS — Z0000 Encounter for general adult medical examination without abnormal findings: Secondary | ICD-10-CM

## 2015-04-07 NOTE — Progress Notes (Signed)
Patient ID: Raven Buchanan, female   DOB: Jan 05, 1946, 70 y.o.   MRN: [id number]      Patient ID: Raven Buchanan, female  DOB: Jan 05, 1946, [age] y.o.   MRN: [id number]  Subjective:  Raven Spieslaine M Liller is a 70 y.o. female present for establishment of care without any records for [place] residence in [place] where she has been seen for 3 years. She is mostly evaluated by a "holistic" doctor,  "Raiford NobleRick" at [facility] [place]. He is moving to the [place] area in a few months and she plans to continue there with him.  All past medical history, surgical history, allergies, family history, immunizations, medications and social history were obtained and entered in the electronic medical record today. All recent labs, ED visits and hospitalizations within the last year were reviewed.  She reports she takes many holistic/herbal supplements, by the guidance of Dr. Raiford Nobleick at [facility] [place]. She reports she makes her own compounds as well.  She reports she did have physical therapy last year for repercussions from her "uterine prolapse ". She states that she had pelvic pain and discomfort. The focus was on the left side, she states she is now getting similar symptoms on the right side and is performing stretches at home to help resolve. She reports she has a gynecologist, but does not recall the name. She states she now has a pessary. Patient reports she's had lab work completed, she does not think she needs any additional lab work at this time. She will have records forwarded. She does state she would like to have her immunity testing, but she forgets the name of the tissue wants completed.  Health maintenance:  Colonoscopy: Never colonoscopy. No Fhx. Does not desire unless a "reason" presents.  Mammogram: Declines all mammograms.  Cervical cancer screening: Not indicated  Immunizations: Pt declines Flu and tdap. Declines all vaccinations.  Reports allergy to egg/flu shot, but able to eat "farm raised/organic/cage free eggs" Infectious disease screening: Unknown, declines screening DEXA: had scan completed 2-3 years ago, no report available. She was told mild softening of the bone. She takes calcium, vit D 5000u daily and holistic mixture.  Last eye exam: 2016    Past Medical History  Diagnosis Date  . Allergy   . Anemia   . Asthma   . Chicken pox    Allergies  Allergen Reactions  . Amoxicillin   . Erythromycin   . Influenza Vaccines   . Penicillins   . Sulfites    Past Surgical History  Procedure Laterality Date  . Tonsillectomy and adenoidectomy    . Prolapse uterus  1970    repaired   Family History  Problem Relation Age of Onset  . Arthritis Mother   . Diabetes Mother   . Heart disease Mother   . Diabetes Father   . Heart disease Father    Social History   Social History  . Marital Status: Married    Spouse Name: N/A  . Number of Children: N/A  . Years of Education: N/A   Occupational History  . Not on file.   Social History Main Topics  . Smoking status: Never Smoker   . Smokeless tobacco: Not on file  . Alcohol Use: No  . Drug Use: No  . Sexual Activity: Yes    Birth Control/ Protection: None   Other Topics Concern  . Not on file   Social  History Narrative     ROS: Negative, with the exception of above mentioned in HPI  Objective: BP 116/77 mmHg  Pulse 74  Temp(Src) 98.4 F (36.9 C) (Oral)  Resp 20  Ht  (1.626 m)  Wt 138 lb (62.596 kg)  BMI 23.68 kg/m2  SpO2 96% Gen: Afebrile. No acute distress. Nontoxic in appearance, well-developed, well-nourished, female, talkative.  HENT: AT. [place].  MMM, no oral lesions Eyes:Pupils Equal Round Reactive to light, Extraocular movements intact,  Conjunctiva without redness, discharge or icterus. Neck/lymp/endocrine: Supple, no lymphadenopathy, no thyromegaly CV: RRR, noedema, +2/4 P posterior tibialis pulses.  Chest: CTAB, no  wheeze, rhonchi or crackles. Normal Respiratory effort. Good Air movement. Abd: Soft. Flat. ND. Mild tenderness to deep palpation right lower quadrant. BS present. No Masses palpated. No hepatosplenomegaly. No rebound tenderness or guarding. Skin: No rashes, purpura or petechiae. Warm and well-perfused. Skin intact. Neuro/Msk: Normal gait. PERLA. EOMi. Alert. Oriented x3.   Psych: Normal dress. Normal speech. Tangential thought. Needs frequent redirection.  Assessment/plan: [name] is a 70 y.o. female present for establishment.  Establishing care with new doctor, encounter for Breast screening declined Diagnostic procedure declined Influenza vaccination declined by patient Pneumococcal vaccination declined by patient Immunization not carried out because of patient decision- tdap Health care maintenance - DEXA scan likely due for repeat. Will await records. Patient declines any other screening or immunizations today. She reports she does not take immunizations, or complete screenings without a cause. - Patient will forward medical records.   Follow-up yearly for annual physical  Electronically signed by: Felix Pacini, DO [facility]

## 2015-04-07 NOTE — Patient Instructions (Signed)
Health Maintenance, Female Adopting a healthy lifestyle and getting preventive care can go a long way to promote health and wellness. Talk with your health care provider about what schedule of regular examinations is right for you. This is a good chance for you to check in with your provider about disease prevention and staying healthy. In between checkups, there are plenty of things you can do on your own. Experts have done a lot of research about which lifestyle changes and preventive measures are most likely to keep you healthy. Ask your health care provider for more information. WEIGHT AND DIET  Eat a healthy diet  Be sure to include plenty of vegetables, fruits, low-fat dairy products, and lean protein.  Do not eat a lot of foods high in solid fats, added sugars, or salt.  Get regular exercise. This is one of the most important things you can do for your health.  Most adults should exercise for at least 150 minutes each week. The exercise should increase your heart rate and make you sweat (moderate-intensity exercise).  Most adults should also do strengthening exercises at least twice a week. This is in addition to the moderate-intensity exercise.  Maintain a healthy weight  Body mass index (BMI) is a measurement that can be used to identify possible weight problems. It estimates body fat based on height and weight. Your health care provider can help determine your BMI and help you achieve or maintain a healthy weight.  For females 20 years of age and older:   A BMI below 18.5 is considered underweight.  A BMI of 18.5 to 24.9 is normal.  A BMI of 25 to 29.9 is considered overweight.  A BMI of 30 and above is considered obese.  Watch levels of cholesterol and blood lipids  You should start having your blood tested for lipids and cholesterol at 72 years of age, then have this test every 5 years.  You may need to have your cholesterol levels checked more often if:  Your lipid  or cholesterol levels are high.  You are older than 72 years of age.  You are at high risk for heart disease.  CANCER SCREENING   Lung Cancer  Lung cancer screening is recommended for adults 55-80 years old who are at high risk for lung cancer because of a history of smoking.  A yearly low-dose CT scan of the lungs is recommended for people who:  Currently smoke.  Have quit within the past 15 years.  Have at least a 30-pack-year history of smoking. A pack year is smoking an average of one pack of cigarettes a day for 1 year.  Yearly screening should continue until it has been 15 years since you quit.  Yearly screening should stop if you develop a health problem that would prevent you from having lung cancer treatment.  Breast Cancer  Practice breast self-awareness. This means understanding how your breasts normally appear and feel.  It also means doing regular breast self-exams. Let your health care provider know about any changes, no matter how small.  If you are in your 20s or 30s, you should have a clinical breast exam (CBE) by a health care provider every 1-3 years as part of a regular health exam.  If you are 40 or older, have a CBE every year. Also consider having a breast X-ray (mammogram) every year.  If you have a family history of breast cancer, talk to your health care provider about genetic screening.  If you   are at high risk for breast cancer, talk to your health care provider about having an MRI and a mammogram every year.  Breast cancer gene (BRCA) assessment is recommended for women who have family members with BRCA-related cancers. BRCA-related cancers include:  Breast.  Ovarian.  Tubal.  Peritoneal cancers.  Results of the assessment will determine the need for genetic counseling and BRCA1 and BRCA2 testing. Cervical Cancer Your health care provider may recommend that you be screened regularly for cancer of the pelvic organs (ovaries, uterus, and  vagina). This screening involves a pelvic examination, including checking for microscopic changes to the surface of your cervix (Pap test). You may be encouraged to have this screening done every 3 years, beginning at age 21.  For women ages 30-65, health care providers may recommend pelvic exams and Pap testing every 3 years, or they may recommend the Pap and pelvic exam, combined with testing for human papilloma virus (HPV), every 5 years. Some types of HPV increase your risk of cervical cancer. Testing for HPV may also be done on women of any age with unclear Pap test results.  Other health care providers may not recommend any screening for nonpregnant women who are considered low risk for pelvic cancer and who do not have symptoms. Ask your health care provider if a screening pelvic exam is right for you.  If you have had past treatment for cervical cancer or a condition that could lead to cancer, you need Pap tests and screening for cancer for at least 20 years after your treatment. If Pap tests have been discontinued, your risk factors (such as having a new sexual partner) need to be reassessed to determine if screening should resume. Some women have medical problems that increase the chance of getting cervical cancer. In these cases, your health care provider may recommend more frequent screening and Pap tests. Colorectal Cancer  This type of cancer can be detected and often prevented.  Routine colorectal cancer screening usually begins at 72 years of age and continues through 72 years of age.  Your health care provider may recommend screening at an earlier age if you have risk factors for colon cancer.  Your health care provider may also recommend using home test kits to check for hidden blood in the stool.  A small camera at the end of a tube can be used to examine your colon directly (sigmoidoscopy or colonoscopy). This is done to check for the earliest forms of colorectal  cancer.  Routine screening usually begins at age 50.  Direct examination of the colon should be repeated every 5-10 years through 72 years of age. However, you may need to be screened more often if early forms of precancerous polyps or small growths are found. Skin Cancer  Check your skin from head to toe regularly.  Tell your health care provider about any new moles or changes in moles, especially if there is a change in a mole's shape or color.  Also tell your health care provider if you have a mole that is larger than the size of a pencil eraser.  Always use sunscreen. Apply sunscreen liberally and repeatedly throughout the day.  Protect yourself by wearing long sleeves, pants, a wide-brimmed hat, and sunglasses whenever you are outside. HEART DISEASE, DIABETES, AND HIGH BLOOD PRESSURE   High blood pressure causes heart disease and increases the risk of stroke. High blood pressure is more likely to develop in:  People who have blood pressure in the high end   of the normal range (130-139/85-89 mm Hg).  People who are overweight or obese.  People who are African American.  If you are 38-23 years of age, have your blood pressure checked every 3-5 years. If you are 61 years of age or older, have your blood pressure checked every year. You should have your blood pressure measured twice--once when you are at a hospital or clinic, and once when you are not at a hospital or clinic. Record the average of the two measurements. To check your blood pressure when you are not at a hospital or clinic, you can use:  An automated blood pressure machine at a pharmacy.  A home blood pressure monitor.  If you are between 45 years and 39 years old, ask your health care provider if you should take aspirin to prevent strokes.  Have regular diabetes screenings. This involves taking a blood sample to check your fasting blood sugar level.  If you are at a normal weight and have a low risk for diabetes,  have this test once every three years after 72 years of age.  If you are overweight and have a high risk for diabetes, consider being tested at a younger age or more often. PREVENTING INFECTION  Hepatitis B  If you have a higher risk for hepatitis B, you should be screened for this virus. You are considered at high risk for hepatitis B if:  You were born in a country where hepatitis B is common. Ask your health care provider which countries are considered high risk.  Your parents were born in a high-risk country, and you have not been immunized against hepatitis B (hepatitis B vaccine).  You have HIV or AIDS.  You use needles to inject street drugs.  You live with someone who has hepatitis B.  You have had sex with someone who has hepatitis B.  You get hemodialysis treatment.  You take certain medicines for conditions, including cancer, organ transplantation, and autoimmune conditions. Hepatitis C  Blood testing is recommended for:  Everyone born from [birth year] through 1967.  Anyone with known risk factors for hepatitis C. Sexually transmitted infections (STIs)  You should be screened for sexually transmitted infections (STIs) including gonorrhea and chlamydia if:  You are sexually active and are younger than 72 years of age.  You are older than 72 years of age and your health care provider tells you that you are at risk for this type of infection.  Your sexual activity has changed since you were last screened and you are at an increased risk for chlamydia or gonorrhea. Ask your health care provider if you are at risk.  If you do not have HIV, but are at risk, it may be recommended that you take a prescription medicine daily to prevent HIV infection. This is called pre-exposure prophylaxis (PrEP). You are considered at risk if:  You are sexually active and do not regularly use condoms or know the HIV status of your partner(s).  You take drugs by injection.  You are sexually  active with a partner who has HIV. Talk with your health care provider about whether you are at high risk of being infected with HIV. If you choose to begin PrEP, you should first be tested for HIV. You should then be tested every 3 months for as long as you are taking PrEP.  PREGNANCY   If you are premenopausal and you may become pregnant, ask your health care provider about preconception counseling.  If you may  become pregnant, take 400 to 800 micrograms (mcg) of folic acid every day.  If you want to prevent pregnancy, talk to your health care provider about birth control (contraception). OSTEOPOROSIS AND MENOPAUSE   Osteoporosis is a disease in which the bones lose minerals and strength with aging. This can result in serious bone fractures. Your risk for osteoporosis can be identified using a bone density scan.  If you are 48 years of age or older, or if you are at risk for osteoporosis and fractures, ask your health care provider if you should be screened.  Ask your health care provider whether you should take a calcium or vitamin D supplement to lower your risk for osteoporosis.  Menopause may have certain physical symptoms and risks.  Hormone replacement therapy may reduce some of these symptoms and risks. Talk to your health care provider about whether hormone replacement therapy is right for you.  HOME CARE INSTRUCTIONS   Schedule regular health, dental, and eye exams.  Stay current with your immunizations.   Do not use any tobacco products including cigarettes, chewing tobacco, or electronic cigarettes.  If you are pregnant, do not drink alcohol.  If you are breastfeeding, limit how much and how often you drink alcohol.  Limit alcohol intake to no more than 1 drink per day for nonpregnant women. One drink equals 12 ounces of beer, 5 ounces of wine, or 1 ounces of hard liquor.  Do not use street drugs.  Do not share needles.  Ask your health care provider for help if  you need support or information about quitting drugs.  Tell your health care provider if you often feel depressed.  Tell your health care provider if you have ever been abused or do not feel safe at home.   This information is not intended to replace advice given to you by your health care provider. Make sure you discuss any questions you have with your health care provider.   Document Released: 07/12/2010 Document Revised: 01/17/2014 Document Reviewed: 11/28/2012 Elsevier Interactive Patient Education [employer].  We are here if you need [place].

## 2015-04-22 ENCOUNTER — Encounter: Payer: Self-pay | Admitting: Family Medicine

## 2015-04-22 DIAGNOSIS — M810 Age-related osteoporosis without current pathological fracture: Secondary | ICD-10-CM | POA: Insufficient documentation

## 2015-05-22 ENCOUNTER — Ambulatory Visit (INDEPENDENT_AMBULATORY_CARE_PROVIDER_SITE_OTHER): Payer: PPO | Admitting: Family Medicine

## 2015-05-22 ENCOUNTER — Encounter: Payer: Self-pay | Admitting: Family Medicine

## 2015-05-22 VITALS — BP 124/77 | HR 74 | Temp 97.5°F | Resp 20 | Wt 137.5 lb

## 2015-05-22 DIAGNOSIS — Z207 Contact with and (suspected) exposure to pediculosis, acariasis and other infestations: Secondary | ICD-10-CM

## 2015-05-22 DIAGNOSIS — Z2089 Contact with and (suspected) exposure to other communicable diseases: Secondary | ICD-10-CM

## 2015-05-22 NOTE — Progress Notes (Signed)
Patient ID: Raven Buchanan, female   DOB: Aug 27, 1945, 70 y.o.   MRN: [id number]    Raven Buchanan , Aug 27, 1945, 70 y.o., female MRN: [id number]  CC: scabies Subjective: Pt presents for an acute OV with complaints of scabies. She was treated with permethrin  2 weeks ago. Associated symptoms include severe itching. He states her husband was diagnosed with scabies infection, he was treated, and she was treated one week later. She states that she has cleaned the entire house as she was instructed. She states she doesn't know right inner head or if she still has scabies. She feels intense itching at her bilateral ankles and intermittently in other locations throughout her body. She denies any current rash or redness.    Allergies  Allergen Reactions  . Amoxicillin   . Eggs Or Egg-Derived Products   . Erythromycin   . Influenza Vaccines   . Penicillins   . Sulfites    Social History  Substance Use Topics  . Smoking status: Never Smoker   . Smokeless tobacco: Never Used  . Alcohol Use: No   Past Medical History  Diagnosis Date  . Allergy   . Anemia   . Asthma   . Chicken pox   . Nephrolithiasis 2009  . Prolapsed uterus   . Bladder prolapse, female, acquired 252015    Seen urology  . Osteoporosis 11/26/2013   Past Surgical History  Procedure Laterality Date  . Tonsillectomy and adenoidectomy  1949  . Prolapse uterus  1970    repaired   Family History  Problem Relation Age of Onset  . Arthritis Mother   . Diabetes Mother   . Heart disease Mother   . Diabetes Father   . Heart disease Father   . CAD Father   . Coronary artery disease Mother   . Hypertension Brother      Medication List       This list is accurate as of: 05/22/15 10:38 AM.  Always use your most recent med list.               Biotin 10 MG Caps  Take by mouth.     CARDIOVASCULAR SUPPORT PO  Take 650 mg by mouth.     Cetyl Myristoleate Powd  Take 1 capsule by mouth.     FISH OIL CONCENTRATE PO  Take  by mouth.     NON FORMULARY  Pine cone extract     OVER THE COUNTER MEDICATION  kelp     OVER THE COUNTER MEDICATION  Stone factor     SM VIT C/ROSE HIPS PO  Take by mouth. Makes own mixture     THYROID PO  Take 60 mg by mouth. 1/2 tab daily     Tyrosine 500 MG Caps  Take 350 mg by mouth.     Vitamin D3 5000 units Caps  Take by mouth.        ROS: Negative, with the exception of above mentioned in HPI  Objective:  BP 124/77 mmHg  Pulse 74  Temp(Src) 97.5 F (36.4 C) (Oral)  Resp 20  Wt 137 lb 8 oz (62.37 kg)  SpO2 94% Body mass index is 23.59 kg/(m^2). Gen: Afebrile. No acute distress. Nontoxic in appearance, well-developed, well-nourished, talkative female. HENT: AT. [place].MMM, no oral lesions.  Eyes:Pupils Equal Round Reactive to light, Extraocular movements intact,  Conjunctiva without redness, discharge or icterus. Skin: No rashes, purpura or petechiae. Skin intact. No notable sick bites or burrowing.  Assessment/Plan:  Raven Buchanan is a 70 y.o. female present for acute OV for  Scabies exposure - Patient has been treated with permethrin. Do not see any active look of scabies infection today.  - Discussed longevity of pruritus after scabies infection. AVS information on scabies provided to patient today. - Follow-up as needed.   electronically signed by:  Felix Pacini, DO  [facility] [place]

## 2015-05-22 NOTE — Patient Instructions (Signed)

## 2015-05-25 DIAGNOSIS — Z207 Contact with and (suspected) exposure to pediculosis, acariasis and other infestations: Secondary | ICD-10-CM | POA: Insufficient documentation

## 2015-05-25 DIAGNOSIS — Z2089 Contact with and (suspected) exposure to other communicable diseases: Secondary | ICD-10-CM | POA: Insufficient documentation

## 2015-10-08 DIAGNOSIS — M9905 Segmental and somatic dysfunction of pelvic region: Secondary | ICD-10-CM | POA: Diagnosis not present

## 2015-10-08 DIAGNOSIS — M9903 Segmental and somatic dysfunction of lumbar region: Secondary | ICD-10-CM | POA: Diagnosis not present

## 2015-10-08 DIAGNOSIS — M9904 Segmental and somatic dysfunction of sacral region: Secondary | ICD-10-CM | POA: Diagnosis not present

## 2015-10-08 DIAGNOSIS — M9901 Segmental and somatic dysfunction of cervical region: Secondary | ICD-10-CM | POA: Diagnosis not present

## 2015-10-20 ENCOUNTER — Encounter: Payer: Self-pay | Admitting: Family Medicine

## 2015-10-20 ENCOUNTER — Ambulatory Visit (INDEPENDENT_AMBULATORY_CARE_PROVIDER_SITE_OTHER): Payer: PPO | Admitting: Family Medicine

## 2015-10-20 VITALS — BP 130/80 | HR 76 | Temp 98.1°F | Resp 20 | Wt 143.8 lb

## 2015-10-20 DIAGNOSIS — M79671 Pain in right foot: Secondary | ICD-10-CM

## 2015-10-20 DIAGNOSIS — R252 Cramp and spasm: Secondary | ICD-10-CM | POA: Diagnosis not present

## 2015-10-20 DIAGNOSIS — M9904 Segmental and somatic dysfunction of sacral region: Secondary | ICD-10-CM | POA: Diagnosis not present

## 2015-10-20 DIAGNOSIS — M79672 Pain in left foot: Secondary | ICD-10-CM | POA: Diagnosis not present

## 2015-10-20 DIAGNOSIS — M9903 Segmental and somatic dysfunction of lumbar region: Secondary | ICD-10-CM | POA: Diagnosis not present

## 2015-10-20 DIAGNOSIS — M9901 Segmental and somatic dysfunction of cervical region: Secondary | ICD-10-CM | POA: Diagnosis not present

## 2015-10-20 DIAGNOSIS — M9905 Segmental and somatic dysfunction of pelvic region: Secondary | ICD-10-CM | POA: Diagnosis not present

## 2015-10-20 LAB — BASIC METABOLIC PANEL WITH GFR
BUN: 13 mg/dL (ref 7–25)
CO2: 30 mmol/L (ref 20–31)
Calcium: 10 mg/dL (ref 8.6–10.4)
Chloride: 99 mmol/L (ref 98–110)
Creat: 0.92 mg/dL (ref 0.60–0.93)
GFR, Est African American: 72 mL/min (ref >=60)
GFR, Est Non African American: 63 mL/min (ref >=60)
Glucose, Bld: 74 mg/dL (ref 65–99)
Potassium: 4.4 mmol/L (ref 3.5–5.3)
Sodium: 136 mmol/L (ref 135–146)

## 2015-10-20 LAB — MAGNESIUM: Magnesium: 2.1 mg/dL (ref 1.5–2.5)

## 2015-10-20 LAB — TSH: TSH: 1.18 u[IU]/mL (ref 0.35–4.50)

## 2015-10-20 LAB — T3, FREE: T3, Free: 3.3 pg/mL (ref 2.3–4.2)

## 2015-10-20 NOTE — Progress Notes (Signed)
Raven Buchanan , 1945-09-13, 70 y.o., female MRN: [id number] Patient Care Team    Relationship Specialty Notifications Start End  Raven Leatherwoodenee A Caswell Alvillar, DO PCP - General Family Medicine  04/07/15     CC: leg cramps Subjective: Pt presents for an acute OV with complaints of swelling feet/hands with cramping the past month. Pt reports the cramping has worsened in her hands/feet. She thought it might have been low magnesium so she increased her supplement, but only started to have diarrhea. She states the cramps are at different times of the day, occasionally at night. She is rather active and dances a few times a week at her church in a group exercise program. She has noticed her right arch of her foot can hurt after. She complains achy pain in her bilateral proximal 1st metatarsal  after dancing. She also states she is on Thyroid medication from [place]. She does not know the active ingredient listed in this medication. She reports a doctor use to have her on thyroid medication a long time ago, but she stopped taking it. She is taking many supplements. She endorses not watching the salt content closely in her diet, and not hydrating with water appropriately.    Allergies  Allergen Reactions  . Amoxicillin   . Eggs Or Egg-Derived Products   . Erythromycin   . Influenza Vaccines   . Penicillins   . Sulfites    Social History  Substance Use Topics  . Smoking status: Never Smoker  . Smokeless tobacco: Never Used  . Alcohol use No   Past Medical History:  Diagnosis Date  . Allergy   . Anemia   . Asthma   . Bladder prolapse, female, acquired 12015   Seen urology  . Chicken pox   . Nephrolithiasis 2009  . Osteoporosis 11/26/2013  . Prolapsed uterus    Past Surgical History:  Procedure Laterality Date  . prolapse uterus  1970   repaired  . TONSILLECTOMY AND ADENOIDECTOMY  1949   Family History  Problem Relation Age of Onset  . Arthritis Mother   . Diabetes Mother   . Heart disease  Mother   . Coronary artery disease Mother   . Diabetes Father   . Heart disease Father   . CAD Father   . Hypertension Brother      Medication List       Accurate as of 10/20/15  9:49 AM. Always use your most recent med list.          Biotin 10 MG Caps Take by mouth.   CARDIOVASCULAR SUPPORT PO Take 650 mg by mouth.   Cetyl Myristoleate Powd Take 1 capsule by mouth.   FISH OIL CONCENTRATE PO Take by mouth.   NON FORMULARY Pine cone extract   OVER THE COUNTER MEDICATION kelp   OVER THE COUNTER MEDICATION Stone factor   SM VIT C/ROSE HIPS PO Take by mouth. Makes own mixture   THYROID PO Take 60 mg by mouth. 1/2 tab daily   Tyrosine 500 MG Caps Take 350 mg by mouth.   Vitamin D3 5000 units Caps Take by mouth.       No results found for this or any previous visit (from the past 24 hour(s)). No results found.   ROS: Negative, with the exception of above mentioned in HPI   Objective:  BP 130/80 (BP Location: Left Arm, Patient Position: Sitting, Cuff Size: Normal)   Pulse 76   Temp 98.1 F (36.7 C)  Resp 20   Wt 143 lb 12 oz (65.2 kg)   SpO2 98%   BMI 24.67 kg/m  Body mass index is 24.67 kg/m. Gen: Afebrile. No acute distress. Nontoxic in appearance, well developed, well nourished.  HENT: AT. .  MMM Eyes:Pupils Equal Round Reactive to light, Extraocular movements intact,  Conjunctiva without redness, discharge or icterus. Neck/lymp/endocrine: Supple,no lymphadenopathy CV: RRR, no edema, +2/4 bilateral PT Chest: CTAB, no wheeze or crackles. Good air movement, normal resp effort.  MSK: No erythema or soft tissue swelling feet or hands. FROM. No obvious deformities. No TTP. NV intact. Skin: no rashes, purpura or petechiae.  Neuro:  Normal gait. PERLA. EOMi. Alert. Oriented x3   Assessment/Plan: [name] is a 70 y.o. female present for acute OV for  Muscle cramps/Pain in both feet - discussed role of hydration, low sodium with cramping.    - pt encouraged to stretch both sciatic nerve and feet a few times a week, and before/after exercising. AVS information on stretches provided.  - Thyroid supplement is a little concerning, no certain the actual active ingredient in that medication or if she is actually suppose to be on thyroid medication (recent est care).  - discussed adding nuts/almonds etc into her diet and getting magnesium naturally instead of increasing supplement that causes diarrhea.  - discussed foot arch support sleeves to use while exercising, as well as a shoe with good arch support.  - TSH - T3, free - T4 - BASIC METABOLIC PANEL WITH GFR - Magnesium - F/u PRN  > 25 minutes spent with patient, >50% of time spent face to face counseling patient and coordinating care.    electronically signed by:  Felix Pacini, DO  [facility] [place]

## 2015-10-20 NOTE — Patient Instructions (Addendum)
Sciatica With Rehab The sciatic nerve runs from the back down the leg and is responsible for sensation and control of the muscles in the back (posterior) side of the thigh, lower leg, and foot. Sciatica is a condition that is characterized by inflammation of this nerve.  SYMPTOMS   Signs of nerve damage, including numbness and/or weakness along the posterior side of the lower extremity.  Pain in the back of the thigh that may also travel down the leg.  Pain that worsens when sitting for long periods of time.  Occasionally, pain in the back or buttock. CAUSES  Inflammation of the sciatic nerve is the cause of sciatica. The inflammation is due to something irritating the nerve. Common sources of irritation include:  Sitting for long periods of time.  Direct trauma to the nerve.  Arthritis of the spine.  Herniated or ruptured disk.  Slipping of the vertebrae (spondylolisthesis).  Pressure from soft tissues, such as muscles or ligament-like tissue (fascia). RISK INCREASES WITH:  Sports that place pressure or stress on the spine (football or weightlifting).  Poor strength and flexibility.  Failure to warm up properly before activity.  Family history of low back pain or disk disorders.  Previous back injury or surgery.  Poor body mechanics, especially when lifting, or poor posture. PREVENTION   Warm up and stretch properly before activity.  Maintain physical fitness:  Strength, flexibility, and endurance.  Cardiovascular fitness.  Learn and use proper technique, especially with posture and lifting. When possible, have coach correct improper technique.  Avoid activities that place stress on the spine. PROGNOSIS If treated properly, then sciatica usually resolves within 6 weeks. However, occasionally surgery is necessary.  RELATED COMPLICATIONS   Permanent nerve damage, including pain, numbness, tingle, or weakness.  Chronic back pain.  Risks of surgery: infection,  bleeding, nerve damage, or damage to surrounding tissues. TREATMENT Treatment initially involves resting from any activities that aggravate your symptoms. The use of ice and medication may help reduce pain and inflammation. The use of strengthening and stretching exercises may help reduce pain with activity. These exercises may be performed at home or with referral to a therapist. A therapist may recommend further treatments, such as transcutaneous electronic nerve stimulation (TENS) or ultrasound. Your caregiver may recommend corticosteroid injections to help reduce inflammation of the sciatic nerve. If symptoms persist despite non-surgical (conservative) treatment, then surgery may be recommended. MEDICATION  If pain medication is necessary, then nonsteroidal anti-inflammatory medications, such as aspirin and ibuprofen, or other minor pain relievers, such as acetaminophen, are often recommended.  Do not take pain medication for 7 days before surgery.  Prescription pain relievers may be given if deemed necessary by your caregiver. Use only as directed and only as much as you need.  Ointments applied to the skin may be helpful.  Corticosteroid injections may be given by your caregiver. These injections should be reserved for the most serious cases, because they may only be given a certain number of times. HEAT AND COLD  Cold treatment (icing) relieves pain and reduces inflammation. Cold treatment should be applied for 10 to 15 minutes every 2 to 3 hours for inflammation and pain and immediately after any activity that aggravates your symptoms. Use ice packs or massage the area with a piece of ice (ice massage).  Heat treatment may be used prior to performing the stretching and strengthening activities prescribed by your caregiver, physical therapist, or athletic trainer. Use a heat pack or soak the injury in warm water.   SEEK MEDICAL CARE IF:  Treatment seems to offer no benefit, or the condition  worsens.  Any medications produce adverse side effects. EXERCISES  RANGE OF MOTION (ROM) AND STRETCHING EXERCISES - Sciatica Most people with sciatic will find that their symptoms worsen with either excessive bending forward (flexion) or arching at the low back (extension). The exercises which will help resolve your symptoms will focus on the opposite motion. Your physician, physical therapist or athletic trainer will help you determine which exercises will be most helpful to resolve your low back pain. Do not complete any exercises without first consulting with your clinician. Discontinue any exercises which worsen your symptoms until you speak to your clinician. If you have pain, numbness or tingling which travels down into your buttocks, leg or foot, the goal of the therapy is for these symptoms to move closer to your back and eventually resolve. Occasionally, these leg symptoms will get better, but your low back pain may worsen; this is typically an indication of progress in your rehabilitation. Be certain to be very alert to any changes in your symptoms and the activities in which you participated in the 24 hours prior to the change. Sharing this information with your clinician will allow him/her to most efficiently treat your condition. These exercises may help you when beginning to rehabilitate your injury. Your symptoms may resolve with or without further involvement from your physician, physical therapist or athletic trainer. While completing these exercises, remember:   Restoring tissue flexibility helps normal motion to return to the joints. This allows healthier, less painful movement and activity.  An effective stretch should be held for at least 30 seconds.  A stretch should never be painful. You should only feel a gentle lengthening or release in the stretched tissue. FLEXION RANGE OF MOTION AND STRETCHING EXERCISES: STRETCH - Flexion, Single Knee to Chest   Lie on a firm bed or floor  with both legs extended in front of you.  Keeping one leg in contact with the floor, bring your opposite knee to your chest. Hold your leg in place by either grabbing behind your thigh or at your knee.  Pull until you feel a gentle stretch in your low back. Hold __________ seconds.  Slowly release your grasp and repeat the exercise with the opposite side. Repeat __________ times. Complete this exercise __________ times per day.  STRETCH - Flexion, Double Knee to Chest  Lie on a firm bed or floor with both legs extended in front of you.  Keeping one leg in contact with the floor, bring your opposite knee to your chest.  Tense your stomach muscles to support your back and then lift your other knee to your chest. Hold your legs in place by either grabbing behind your thighs or at your knees.  Pull both knees toward your chest until you feel a gentle stretch in your low back. Hold __________ seconds.  Tense your stomach muscles and slowly return one leg at a time to the floor. Repeat __________ times. Complete this exercise __________ times per day.  STRETCH - Low Trunk Rotation   Lie on a firm bed or floor. Keeping your legs in front of you, bend your knees so they are both pointed toward the ceiling and your feet are flat on the floor.  Extend your arms out to the side. This will stabilize your upper body by keeping your shoulders in contact with the floor.  Gently and slowly drop both knees together to one side until   you feel a gentle stretch in your low back. Hold for __________ seconds.  Tense your stomach muscles to support your low back as you bring your knees back to the starting position. Repeat the exercise to the other side. Repeat __________ times. Complete this exercise __________ times per day  EXTENSION RANGE OF MOTION AND FLEXIBILITY EXERCISES: STRETCH - Extension, Prone on Elbows  Lie on your stomach on the floor, a bed will be too soft. Place your palms about shoulder  width apart and at the height of your head.  Place your elbows under your shoulders. If this is too painful, stack pillows under your chest.  Allow your body to relax so that your hips drop lower and make contact more completely with the floor.  Hold this position for __________ seconds.  Slowly return to lying flat on the floor. Repeat __________ times. Complete this exercise __________ times per day.  RANGE OF MOTION - Extension, Prone Press Ups  Lie on your stomach on the floor, a bed will be too soft. Place your palms about shoulder width apart and at the height of your head.  Keeping your back as relaxed as possible, slowly straighten your elbows while keeping your hips on the floor. You may adjust the placement of your hands to maximize your comfort. As you gain motion, your hands will come more underneath your shoulders.  Hold this position __________ seconds.  Slowly return to lying flat on the floor. Repeat __________ times. Complete this exercise __________ times per day.  STRENGTHENING EXERCISES - Sciatica  These exercises may help you when beginning to rehabilitate your injury. These exercises should be done near your "sweet spot." This is the neutral, low-back arch, somewhere between fully rounded and fully arched, that is your least painful position. When performed in this safe range of motion, these exercises can be used for people who have either a flexion or extension based injury. These exercises may resolve your symptoms with or without further involvement from your physician, physical therapist or athletic trainer. While completing these exercises, remember:   Muscles can gain both the endurance and the strength needed for everyday activities through controlled exercises.  Complete these exercises as instructed by your physician, physical therapist or athletic trainer. Progress with the resistance and repetition exercises only as your caregiver advises.  You may  experience muscle soreness or fatigue, but the pain or discomfort you are trying to eliminate should never worsen during these exercises. If this pain does worsen, stop and make certain you are following the directions exactly. If the pain is still present after adjustments, discontinue the exercise until you can discuss the trouble with your clinician. STRENGTHENING - Deep Abdominals, Pelvic Tilt   Lie on a firm bed or floor. Keeping your legs in front of you, bend your knees so they are both pointed toward the ceiling and your feet are flat on the floor.  Tense your lower abdominal muscles to press your low back into the floor. This motion will rotate your pelvis so that your tail bone is scooping upwards rather than pointing at your feet or into the floor.  With a gentle tension and even breathing, hold this position for __________ seconds. Repeat __________ times. Complete this exercise __________ times per day.  STRENGTHENING - Abdominals, Crunches   Lie on a firm bed or floor. Keeping your legs in front of you, bend your knees so they are both pointed toward the ceiling and your feet are flat on the   floor. Cross your arms over your chest.  Slightly tip your chin down without bending your neck.  Tense your abdominals and slowly lift your trunk high enough to just clear your shoulder blades. Lifting higher can put excessive stress on the low back and does not further strengthen your abdominal muscles.  Control your return to the starting position. Repeat __________ times. Complete this exercise __________ times per day.  STRENGTHENING - Quadruped, Opposite UE/LE Lift  Assume a hands and knees position on a firm surface. Keep your hands under your shoulders and your knees under your hips. You may place padding under your knees for comfort.  Find your neutral spine and gently tense your abdominal muscles so that you can maintain this position. Your shoulders and hips should form a rectangle  that is parallel with the floor and is not twisted.  Keeping your trunk steady, lift your right hand no higher than your shoulder and then your left leg no higher than your hip. Make sure you are not holding your breath. Hold this position __________ seconds.  Continuing to keep your abdominal muscles tense and your back steady, slowly return to your starting position. Repeat with the opposite arm and leg. Repeat __________ times. Complete this exercise __________ times per day.  STRENGTHENING - Abdominals and Quadriceps, Straight Leg Raise   Lie on a firm bed or floor with both legs extended in front of you.  Keeping one leg in contact with the floor, bend the other knee so that your foot can rest flat on the floor.  Find your neutral spine, and tense your abdominal muscles to maintain your spinal position throughout the exercise.  Slowly lift your straight leg off the floor about 6 inches for a count of 15, making sure to not hold your breath.  Still keeping your neutral spine, slowly lower your leg all the way to the floor. Repeat this exercise with each leg __________ times. Complete this exercise __________ times per day. POSTURE AND BODY MECHANICS CONSIDERATIONS - Sciatica Keeping correct posture when sitting, standing or completing your activities will reduce the stress put on different body tissues, allowing injured tissues a chance to heal and limiting painful experiences. The following are general guidelines for improved posture. Your physician or physical therapist will provide you with any instructions specific to your needs. While reading these guidelines, remember:  The exercises prescribed by your provider will help you have the flexibility and strength to maintain correct postures.  The correct posture provides the optimal environment for your joints to work. All of your joints have less wear and tear when properly supported by a spine with good posture. This means you will  experience a healthier, less painful body.  Correct posture must be practiced with all of your activities, especially prolonged sitting and standing. Correct posture is as important when doing repetitive low-stress activities (typing) as it is when doing a single heavy-load activity (lifting). RESTING POSITIONS Consider which positions are most painful for you when choosing a resting position. If you have pain with flexion-based activities (sitting, bending, stooping, squatting), choose a position that allows you to rest in a less flexed posture. You would want to avoid curling into a fetal position on your side. If your pain worsens with extension-based activities (prolonged standing, working overhead), avoid resting in an extended position such as sleeping on your stomach. Most people will find more comfort when they rest with their spine in a more neutral position, neither too rounded nor too   arched. Lying on a non-sagging bed on your side with a pillow between your knees, or on your back with a pillow under your knees will often provide some relief. Keep in mind, being in any one position for a prolonged period of time, no matter how correct your posture, can still lead to stiffness. PROPER SITTING POSTURE In order to minimize stress and discomfort on your spine, you must sit with correct posture Sitting with good posture should be effortless for a healthy body. Returning to good posture is a gradual process. Many people can work toward this most comfortably by using various supports until they have the flexibility and strength to maintain this posture on their own. When sitting with proper posture, your ears will fall over your shoulders and your shoulders will fall over your hips. You should use the back of the chair to support your upper back. Your low back will be in a neutral position, just slightly arched. You may place a small pillow or folded towel at the base of your low back for support.  When  working at a desk, create an environment that supports good, upright posture. Without extra support, muscles fatigue and lead to excessive strain on joints and other tissues. Keep these recommendations in mind: CHAIR:   A chair should be able to slide under your desk when your back makes contact with the back of the chair. This allows you to work closely.  The chair's height should allow your eyes to be level with the upper part of your monitor and your hands to be slightly lower than your elbows. BODY POSITION  Your feet should make contact with the floor. If this is not possible, use a foot rest.  Keep your ears over your shoulders. This will reduce stress on your neck and low back. INCORRECT SITTING POSTURES   If you are feeling tired and unable to assume a healthy sitting posture, do not slouch or slump. This puts excessive strain on your back tissues, causing more damage and pain. Healthier options include:  Using more support, like a lumbar pillow.  Switching tasks to something that requires you to be upright or walking.  Talking a brief walk.  Lying down to rest in a neutral-spine position. PROLONGED STANDING WHILE SLIGHTLY LEANING FORWARD  When completing a task that requires you to lean forward while standing in one place for a long time, place either foot up on a stationary 2-4 inch high object to help maintain the best posture. When both feet are on the ground, the low back tends to lose its slight inward curve. If this curve flattens (or becomes too large), then the back and your other joints will experience too much stress, fatigue more quickly and can cause pain.  CORRECT STANDING POSTURES Proper standing posture should be assumed with all daily activities, even if they only take a few moments, like when brushing your teeth. As in sitting, your ears should fall over your shoulders and your shoulders should fall over your hips. You should keep a slight tension in your abdominal  muscles to brace your spine. Your tailbone should point down to the ground, not behind your body, resulting in an over-extended swayback posture.  INCORRECT STANDING POSTURES  Common incorrect standing postures include a forward head, locked knees and/or an excessive swayback. WALKING Walk with an upright posture. Your ears, shoulders and hips should all line-up. PROLONGED ACTIVITY IN A FLEXED POSITION When completing a task that requires you to bend forward   at your waist or lean over a low surface, try to find a way to stabilize 3 of 4 of your limbs. You can place a hand or elbow on your thigh or rest a knee on the surface you are reaching across. This will provide you more stability so that your muscles do not fatigue as quickly. By keeping your knees relaxed, or slightly bent, you will also reduce stress across your low back. CORRECT LIFTING TECHNIQUES DO :   Assume a wide stance. This will provide you more stability and the opportunity to get as close as possible to the object which you are lifting.  Tense your abdominals to brace your spine; then bend at the knees and hips. Keeping your back locked in a neutral-spine position, lift using your leg muscles. Lift with your legs, keeping your back straight.  Test the weight of unknown objects before attempting to lift them.  Try to keep your elbows locked down at your sides in order get the best strength from your shoulders when carrying an object.  Always ask for help when lifting heavy or awkward objects. INCORRECT LIFTING TECHNIQUES DO NOT:   Lock your knees when lifting, even if it is a small object.  Bend and twist. Pivot at your feet or move your feet when needing to change directions.  Assume that you cannot safely pick up a paperclip without proper posture.   This information is not intended to replace advice given to you by your health care provider. Make sure you discuss any questions you have with your health care provider.     Document Released: 12/27/2004 Document Revised: 05/13/2014 Document Reviewed: 04/10/2008 Elsevier Interactive Patient Education 2016 [employer]    Try arch support for feet.  Stretches for feet and sciatic nerve before after dancing.  Icing area after dance.

## 2015-10-21 ENCOUNTER — Telehealth: Payer: Self-pay | Admitting: Family Medicine

## 2015-10-21 LAB — T4: T4, Total: 7.9 ug/dL (ref 4.5–12.0)

## 2015-10-21 NOTE — Telephone Encounter (Signed)
Please call pt: - her labs are normal.  

## 2015-10-21 NOTE — Telephone Encounter (Signed)
Spoke with patient reviewed lab results. 

## 2015-11-26 DIAGNOSIS — J309 Allergic rhinitis, unspecified: Secondary | ICD-10-CM | POA: Diagnosis not present

## 2015-11-26 DIAGNOSIS — K439 Ventral hernia without obstruction or gangrene: Secondary | ICD-10-CM | POA: Diagnosis not present

## 2015-11-26 DIAGNOSIS — R1032 Left lower quadrant pain: Secondary | ICD-10-CM | POA: Diagnosis not present

## 2015-11-27 ENCOUNTER — Ambulatory Visit
Admission: RE | Admit: 2015-11-27 | Discharge: 2015-11-27 | Disposition: A | Discharge status: Home / Self Care | Payer: PPO | Source: Ambulatory Visit | Attending: Family Medicine | Admitting: Family Medicine

## 2015-11-27 ENCOUNTER — Other Ambulatory Visit: Payer: Self-pay | Admitting: Family Medicine

## 2015-11-27 DIAGNOSIS — R1032 Left lower quadrant pain: Secondary | ICD-10-CM | POA: Diagnosis not present

## 2015-11-27 DIAGNOSIS — R109 Unspecified abdominal pain: Secondary | ICD-10-CM

## 2015-12-09 DIAGNOSIS — N8182 Incompetence or weakening of pubocervical tissue: Secondary | ICD-10-CM | POA: Diagnosis not present

## 2016-02-12 DIAGNOSIS — M9905 Segmental and somatic dysfunction of pelvic region: Secondary | ICD-10-CM | POA: Diagnosis not present

## 2016-02-12 DIAGNOSIS — M9903 Segmental and somatic dysfunction of lumbar region: Secondary | ICD-10-CM | POA: Diagnosis not present

## 2016-02-29 DIAGNOSIS — H6122 Impacted cerumen, left ear: Secondary | ICD-10-CM | POA: Diagnosis not present

## 2016-02-29 DIAGNOSIS — H6692 Otitis media, unspecified, left ear: Secondary | ICD-10-CM | POA: Diagnosis not present

## 2016-02-29 DIAGNOSIS — J101 Influenza due to other identified influenza virus with other respiratory manifestations: Secondary | ICD-10-CM | POA: Diagnosis not present

## 2016-04-29 DIAGNOSIS — M79644 Pain in right finger(s): Secondary | ICD-10-CM | POA: Diagnosis not present

## 2016-04-29 DIAGNOSIS — M25531 Pain in right wrist: Secondary | ICD-10-CM | POA: Diagnosis not present

## 2016-04-29 DIAGNOSIS — R030 Elevated blood-pressure reading, without diagnosis of hypertension: Secondary | ICD-10-CM | POA: Diagnosis not present

## 2016-05-06 DIAGNOSIS — R030 Elevated blood-pressure reading, without diagnosis of hypertension: Secondary | ICD-10-CM | POA: Diagnosis not present

## 2016-05-06 DIAGNOSIS — M25531 Pain in right wrist: Secondary | ICD-10-CM | POA: Diagnosis not present

## 2016-05-12 DIAGNOSIS — M9901 Segmental and somatic dysfunction of cervical region: Secondary | ICD-10-CM | POA: Diagnosis not present

## 2016-05-12 DIAGNOSIS — M9904 Segmental and somatic dysfunction of sacral region: Secondary | ICD-10-CM | POA: Diagnosis not present

## 2016-05-12 DIAGNOSIS — M9902 Segmental and somatic dysfunction of thoracic region: Secondary | ICD-10-CM | POA: Diagnosis not present

## 2016-05-12 DIAGNOSIS — M9907 Segmental and somatic dysfunction of upper extremity: Secondary | ICD-10-CM | POA: Diagnosis not present

## 2016-05-18 DIAGNOSIS — M9907 Segmental and somatic dysfunction of upper extremity: Secondary | ICD-10-CM | POA: Diagnosis not present

## 2016-05-18 DIAGNOSIS — M9902 Segmental and somatic dysfunction of thoracic region: Secondary | ICD-10-CM | POA: Diagnosis not present

## 2016-08-11 DIAGNOSIS — B880 Other acariasis: Secondary | ICD-10-CM | POA: Diagnosis not present

## 2016-12-06 DIAGNOSIS — M9902 Segmental and somatic dysfunction of thoracic region: Secondary | ICD-10-CM | POA: Diagnosis not present

## 2016-12-06 DIAGNOSIS — M9907 Segmental and somatic dysfunction of upper extremity: Secondary | ICD-10-CM | POA: Diagnosis not present

## 2016-12-06 DIAGNOSIS — M9904 Segmental and somatic dysfunction of sacral region: Secondary | ICD-10-CM | POA: Diagnosis not present

## 2017-04-20 DIAGNOSIS — M9907 Segmental and somatic dysfunction of upper extremity: Secondary | ICD-10-CM | POA: Diagnosis not present

## 2017-04-20 DIAGNOSIS — M9903 Segmental and somatic dysfunction of lumbar region: Secondary | ICD-10-CM | POA: Diagnosis not present

## 2017-04-20 DIAGNOSIS — M9904 Segmental and somatic dysfunction of sacral region: Secondary | ICD-10-CM | POA: Diagnosis not present

## 2017-05-02 ENCOUNTER — Telehealth: Payer: Self-pay | Admitting: *Deleted

## 2017-05-02 NOTE — Telephone Encounter (Signed)
Ok with me 

## 2017-05-02 NOTE — Telephone Encounter (Signed)
Copied from CRM [id number]. Topic: General - Other >> May 02, 2017  1:17 PM Eston Mouldavis, Cheri B wrote: Pt is asking to transfer care to [facility]

## 2017-05-03 ENCOUNTER — Telehealth: Payer: Self-pay

## 2017-05-03 DIAGNOSIS — M9907 Segmental and somatic dysfunction of upper extremity: Secondary | ICD-10-CM | POA: Diagnosis not present

## 2017-05-03 DIAGNOSIS — M9903 Segmental and somatic dysfunction of lumbar region: Secondary | ICD-10-CM | POA: Diagnosis not present

## 2017-05-03 DIAGNOSIS — M9904 Segmental and somatic dysfunction of sacral region: Secondary | ICD-10-CM | POA: Diagnosis not present

## 2017-05-03 NOTE — Telephone Encounter (Signed)
This was OK'd by Dr Claiborne BillingsKuneff and sent to Dr Doreene BurkeKremer office for response they will have to contact patient.

## 2017-05-03 NOTE — Telephone Encounter (Signed)
Copied from CRM [phone]. Topic: General - Other >> May 02, 2017  1:17 PM Eston Mouldavis, Cheri B wrote: Pt is asking to [facility]  >> May 03, 2017 10:57 AM Arlyss Gandyichardson, Taren N, NT wrote: Pt calling to check status on having permission to switch from Dr. Claiborne BillingsKuneff to Dr. D[facility]Kremer.

## 2017-05-04 ENCOUNTER — Telehealth: Payer: Self-pay

## 2017-05-04 NOTE — Telephone Encounter (Signed)
Plz advise if you both are in agreement with patient tranferring care [facility] due to convenience in respect to her home/thx dmf  Copied from CRM [phone]. Topic: General - Other >> May 04, 2017  4:20 PM Raven SarnaHayes, Raven G wrote: Pt is asking if she can establish care w/ [facility]. [facility] [place] [facility] [place]. Please call pt back to let her know asap.

## 2017-05-05 NOTE — Telephone Encounter (Signed)
Ok

## 2017-05-08 ENCOUNTER — Ambulatory Visit (INDEPENDENT_AMBULATORY_CARE_PROVIDER_SITE_OTHER): Payer: PPO | Admitting: Family Medicine

## 2017-05-08 ENCOUNTER — Encounter: Payer: Self-pay | Admitting: Family Medicine

## 2017-05-08 VITALS — BP 132/80 | HR 91 | Ht 64.0 in | Wt 142.1 lb

## 2017-05-08 DIAGNOSIS — R252 Cramp and spasm: Secondary | ICD-10-CM | POA: Diagnosis not present

## 2017-05-08 DIAGNOSIS — R011 Cardiac murmur, unspecified: Secondary | ICD-10-CM | POA: Diagnosis not present

## 2017-05-08 DIAGNOSIS — Z1231 Encounter for screening mammogram for malignant neoplasm of breast: Secondary | ICD-10-CM

## 2017-05-08 DIAGNOSIS — J301 Allergic rhinitis due to pollen: Secondary | ICD-10-CM | POA: Insufficient documentation

## 2017-05-08 DIAGNOSIS — Z0000 Encounter for general adult medical examination without abnormal findings: Secondary | ICD-10-CM

## 2017-05-08 DIAGNOSIS — Z1211 Encounter for screening for malignant neoplasm of colon: Secondary | ICD-10-CM

## 2017-05-08 DIAGNOSIS — Z0001 Encounter for general adult medical examination with abnormal findings: Secondary | ICD-10-CM

## 2017-05-08 DIAGNOSIS — Z1239 Encounter for other screening for malignant neoplasm of breast: Secondary | ICD-10-CM

## 2017-05-08 MED ORDER — FLUTICASONE PROPIONATE 50 MCG/ACT NA SUSP: 2.0000 | Freq: Every day | NASAL | 2 refills | Status: DC

## 2017-05-08 NOTE — Telephone Encounter (Signed)
Okay to schedule, thank you! 

## 2017-05-08 NOTE — Progress Notes (Signed)
Subjective:  Patient ID: Raven Buchanan, female    DOB: 1945/03/10  Age: 72 y.o. MRN: [id number]  CC: Establish Care   HPI Raven Buchanan presents for establishment of care and for physical exam.  She has not some medical issues she wants to discuss with me.  She has seasonal allergies consist of sneezing with eyes and postnasal drip.  She takes Zyrtec as needed for this.  She has had no fever facial pressure teeth pain or purulent discharge.  She has never had a mammogram or colonoscopy.  She saw her chiropractor took x-rays and was concerned about an abdominal aneurysm.  She has never smoked.  She rarely drinks alcohol and does not use illicit drugs.  She does have a positive family history of coronary artery disease.  She denies chest pain shortness of breath or DOE.  She does not have pain in her legs when she exercise that is relieved with rest.  She sees her GYN regularly for her Pap smears.  Of note she has a history of allergic reactions to multiple antibiotics.  She is active in her church and participates in martial through dance.  She deals with muscle cramps and uses magnesium to treat these with some success.  History Fate has a past medical history of Allergy, Anemia, Asthma, Bladder prolapse, female, acquired (2015), Chicken pox, Nephrolithiasis (2009), Osteoporosis (11/26/2013), and Prolapsed uterus.   She has a past surgical history that includes Tonsillectomy and adenoidectomy (1949) and prolapse uterus (1970).   Her family history includes Arthritis in her mother; CAD in her father; Coronary artery disease in her mother; Diabetes in her father and mother; Heart disease in her father and mother; Hypertension in her brother.She reports that she has never smoked. She has never used smokeless tobacco. She reports that she does not drink alcohol or use drugs.  Outpatient Medications Prior to Visit  Medication Sig Dispense Refill  . Ascorbic Acid (SM VIT C/ROSE HIPS PO) Take by mouth.  Makes own mixture    . Biotin 10 MG CAPS Take by mouth.    Remo Lipps Myristoleate POWD Take 1 capsule by mouth.    . Cholecalciferol (VITAMIN D3) 5000 units CAPS Take by mouth.    . NON FORMULARY Pine cone extract    . Omega-3 Fatty Acids (FISH OIL CONCENTRATE PO) Take by mouth.    Marland Kitchen OVER THE COUNTER MEDICATION Stone factor    . OVER THE COUNTER MEDICATION kelp    . Specialty Vitamins Products (CARDIOVASCULAR SUPPORT PO) Take 650 mg by mouth.    . THYROID PO Take 60 mg by mouth. 1/2 tab daily    . Tyrosine 500 MG CAPS Take 350 mg by mouth.     No facility-administered medications prior to visit.     ROS Review of Systems  Constitutional: Negative for chills, fatigue and fever.  HENT: Positive for congestion, postnasal drip, rhinorrhea and sneezing. Negative for sinus pressure and sinus pain.   Eyes: Positive for itching. Negative for photophobia and visual disturbance.  Respiratory: Positive for cough. Negative for chest tightness and wheezing.   Cardiovascular: Negative.   Gastrointestinal: Negative.   Endocrine: Negative for cold intolerance and heat intolerance.  Genitourinary: Negative.   Musculoskeletal: Negative for gait problem and joint swelling.  Skin: Negative.   Neurological: Negative for tremors and weakness.  Hematological: Does not bruise/bleed easily.  Psychiatric/Behavioral: Negative.     Objective:  BP 132/80   Pulse 91   Ht  (  1.626 m)   Wt 142 lb 2 oz (64.5 kg)   SpO2 96%   BMI 24.40 kg/m   Physical Exam  Constitutional: She is oriented to person, place, and time. She appears well-developed and well-nourished. No distress.  HENT:  Head: Normocephalic and atraumatic.  Right Ear: External ear normal.  Left Ear: External ear normal.  Nose: Nose normal.  Mouth/Throat: Oropharynx is clear and moist.  Eyes: Pupils are equal, round, and reactive to light. Conjunctivae and EOM are normal. Right eye exhibits no discharge. Left eye exhibits no discharge. No  scleral icterus.  Neck: Normal range of motion. Neck supple. No JVD present. No tracheal deviation present. No thyromegaly present.  Cardiovascular: Normal rate and regular rhythm.  Murmur heard.  Systolic murmur is present with a grade of 2/6. Pulmonary/Chest: Effort normal and breath sounds normal.  Abdominal: Soft. Bowel sounds are normal. She exhibits no distension, no abdominal bruit, no pulsatile midline mass and no mass. There is no tenderness. There is no rebound and no guarding.  Lymphadenopathy:    She has no cervical adenopathy.  Neurological: She is alert and oriented to person, place, and time.  Skin: Skin is warm and dry. She is not diaphoretic.  Psychiatric: She has a normal mood and affect. Her behavior is normal.      Assessment & Plan:   Starling was seen today for establish care.  Diagnoses and all orders for this visit:  Screening for breast cancer -     MM Digital Diagnostic Bilat; Future  Screen for colon cancer -     Ambulatory referral to Gastroenterology  Healthcare maintenance -     CBC; Future -     Comprehensive metabolic panel; Future -     Lipid panel; Future -     Urinalysis, Routine w reflex microscopic; Future  Seasonal allergic rhinitis due to pollen -     fluticasone (FLONASE) 50 MCG/ACT nasal spray; Place 2 sprays into both nostrils daily.  Heart murmur -     Ambulatory referral to Cardiology   I have discontinued Marilynne Drivers. Kinnick's Ascorbic Acid (SM VIT C/ROSE HIPS PO), Cetyl Myristoleate, Tyrosine, THYROID PO, Vitamin D3, Specialty Vitamins Products (CARDIOVASCULAR SUPPORT PO), OVER THE COUNTER MEDICATION, Biotin, Omega-3 Fatty Acids (FISH OIL CONCENTRATE PO), OVER THE COUNTER MEDICATION, and NON FORMULARY. I am also having her start on fluticasone.  Meds ordered this encounter  Medications  . fluticasone (FLONASE) 50 MCG/ACT nasal spray    Sig: Place 2 sprays into both nostrils daily.    Dispense:  16 g    Refill:  2     Follow-up:  No follow-ups on file.  Mliss Sax, MD

## 2017-05-08 NOTE — Patient Instructions (Signed)
Allergic Rhinitis, Adult Allergic rhinitis is an allergic reaction that affects the mucous membrane inside the nose. It causes sneezing, a runny or stuffy nose, and the feeling of mucus going down the back of the throat (postnasal drip). Allergic rhinitis can be mild to severe. There are two types of allergic rhinitis:  Seasonal. This type is also called hay fever. It happens only during certain seasons.  Perennial. This type can happen at any time of the year.  What are the causes? This condition happens when the body's defense system (immune system) responds to certain harmless substances called allergens as though they were germs.  Seasonal allergic rhinitis is triggered by pollen, which can come from grasses, trees, and weeds. Perennial allergic rhinitis may be caused by:  House dust mites.  Pet dander.  Mold spores.  What are the signs or symptoms? Symptoms of this condition include:  Sneezing.  Runny or stuffy nose (nasal congestion).  Postnasal drip.  Itchy nose.  Tearing of the eyes.  Trouble sleeping.  Daytime sleepiness.  How is this diagnosed? This condition may be diagnosed based on:  Your medical history.  A physical exam.  Tests to check for related conditions, such as: ? Asthma. ? Pink eye. ? Ear infection. ? Upper respiratory infection.  Tests to find out which allergens trigger your symptoms. These may include skin or blood tests.  How is this treated? There is no cure for this condition, but treatment can help control symptoms. Treatment may include:  Taking medicines that block allergy symptoms, such as antihistamines. Medicine may be given as a shot, nasal spray, or pill.  Avoiding the allergen.  Desensitization. This treatment involves getting ongoing shots until your body becomes less sensitive to the allergen. This treatment may be done if other treatments do not help.  If taking medicine and avoiding the allergen does not work, new,  stronger medicines may be prescribed.  Follow these instructions at home:  Find out what you are allergic to. Common allergens include smoke, dust, and pollen.  Avoid the things you are allergic to. These are some things you can do to help avoid allergens: ? Replace carpet with wood, tile, or vinyl flooring. Carpet can trap dander and dust. ? Do not smoke. Do not allow smoking in your home. ? Change your heating and air conditioning filter at least once a month. ? During allergy season:  Keep windows closed as much as possible.  Plan outdoor activities when pollen counts are lowest. This is usually during the evening hours.  When coming indoors, change clothing and shower before sitting on furniture or bedding.  Take over-the-counter and prescription medicines only as told by your health care provider.  Keep all follow-up visits as told by your health care provider. This is important. Contact a health care provider if:  You have a fever.  You develop a persistent cough.  You make whistling sounds when you breathe (you wheeze).  Your symptoms interfere with your normal daily activities. Get help right away if:  You have shortness of breath. Summary  This condition can be managed by taking medicines as directed and avoiding allergens.  Contact your health care provider if you develop a persistent cough or fever.  During allergy season, keep windows closed as much as possible. This information is not intended to replace advice given to you by your health care provider. Make sure you discuss any questions you have with your health care provider. Document Released: 09/21/2000 Document Revised: 02/04/2016  Document Reviewed: 02/04/2016 Elsevier Interactive Patient Education  2018 [place] Screening for Women A cancer screening is a test or exam that checks for cancer. Your health care provider will recommend specific cancer screenings based on your age, personal  history, and family history of cancer. Work with your health care provider to create a cancer screening schedule that protects your health. Why is cancer screening done? Cancer screening is done to look for cancer in the very early stages, before it spreads and becomes harder to treat and before you would start to notice symptoms. Finding cancer early improves the chances of successful treatment. It may save your life. Who should be screened for cancer? All women should be screened for certain cancers, including breast cancer, cervical cancer, and skin cancer. Your health care provider may recommend screenings for other types of cancer if:  You had cancer before.  You have a family member with cancer.  You have abnormal genes that could increase the risk of cancer.  You have risk factors for certain cancers, such as smoking.  When you should be screened for cancer depends on:  Your age.  Your medical history and your family's medical history.  Certain lifestyle factors, such as smoking.  Environmental exposure, such as to asbestos.  What are some common cancer screenings? Breast cancer Breast cancer screening is done with a test that takes images of breast tissue (mammogram). Here are some screening guidelines:  When you are age 50-44. you will be given the choice to start having mammograms.  When you are age 22-54, you should have a mammogram every year.  You may start having mammograms before age 102 if you have risk factors for breast cancer, such as having an immediate family member with breast cancer.  When you are age 49 or older, you should have a mammogram every 1-2 years for as long as you are in good health and have a life expectancy of 10 years or more.  It is important to know what your breasts look and feel like so you can report any changes to your health care provider.  Cervical cancer Cervical cancer screening is done with a Pap test. This testchecks for  abnormalities, including the virus that causes cervical cancer (human papillomavirus, or HPV). To perform the test, a health care provider takes a swab of cervical cells during a pelvic exam. Screening for cervical cancer with a Pap test should start at age 40. Here are some screening guidelines:  When you are age 63-29, you should have a Pap test every 3 years.  When you are age 20-65, you should have a Pap test and HPV test every 5 years or have a Pap test every 3 years.  You may be screened for cervical cancer more often if you have risk factors for cervical cancer.  If your Pap tests are abnormal, you may have an HPV test.  If you have had the HPV vaccine, you will still be screened for cervical cancer and follow normal screening recommendations.  You do not need to be screened for cervical cancer if any of the following apply to you:  You are older than age 6 and you have not had a serious cervical precancer or cancer in the last 20 years.  Your cervix and uterus have been removed and you have never had cervical cancer or precancerous cells.  Endometrial cancer There is no standard screening test for endometrial cancer, but the cancer can be detected with:  A test of a sample of tissue taken from the lining of the uterus (endometrial tissue biopsy).  A vaginal ultrasound.  Pap tests.  If you are at increased risk for endometrial cancer, you may need to have these tests more often than normal. You are at increased risk if:  You have a family history of ovarian, uterine, or colon cancer.  You are taking tamoxifen, a drug that is used to treat breast cancer.  You have certain types of colon cancer.  If you have reached menopause, it is especially important to talk with your health care provider about any vaginal bleeding or spotting. Screening for endometrial cancer is not recommended for women who do not have symptoms of the cancer, such as vaginal bleeding. Colorectal  cancer Screening for colorectal cancer is recommended starting at age 21 for most women. If you have a family history of colon or rectal cancer or other risk factors, you may need to start having screenings earlier. Talk with your health care provider about which screening test is right for you and how often you should be screened. Colorectal cancer screening looks for cancer or for growths called polyps that often form before cancer starts. Tests to look for cancer or polyps include:  Colonoscopy or flexible sigmoidoscopy. For these procedures, a flexible tube with a small camera is inserted into the rectum.  CT colonography. This test uses X-rays and a contrast dye to check the colon for polyps. If a polyp is found, you may need to have a colonoscopy so the polyp can be located and removed.  Tests to look for cancer in the stool (feces) include:  Guaiac-based fecal occult blood test (FOBT). This test detects blood in stool. It can be done at home with a kit.  Fecal immunochemical test (FIT). This test detects blood in stool. For this test, you will need to collect stool samples at home.  Stool DNA test. This test looks for blood in stool and any changes in DNA that can lead to colon cancer. For this test, you will need to collect a stool sample at home and send it to a lab.  Skin cancer Skin cancer screening is done by checking the skin for unusual moles or spots and any changes in existing moles. Your health care provider should check your skin for signs of skin cancer at every physical exam. You should check your skin every month and tell your health care provider right away if anything looks unusual. Women with a higher-than-normal risk for skin cancer may want to see a skin specialist (dermatologist) for an annual body check. Lung cancer Lung cancer screening is done with a CT scan that looks for abnormal cells in the lungs. Discuss lung cancer screening with your health care provider if you  are 45-18 years old and if any of the following apply to you:  You currently smoke.  You used to smoke heavily.  You have had at least a 30-pack-year smoking history.  You have quit smoking within the past 15 years.  If you smoke heavily or if you used to smoke, you may need to be screened every year. Where to find more information:  [place]: SkinPromotion.no  Centers for Disease Control and Prevention: http://knight-sullivan.biz/  Department of Health and Human Services: BankingDetective.si Contact a health care provider if:  You have concerns about any signs or symptoms of cancer, such as: ? Moles that have an unusual shape or color. ? Changes in existing moles. ?  A sore on your skin that does not heal. ? Blood in your stool. ? Fatigue that does not go away. ? Frequent pain or cramping in your abdomen. ? Coughing, or coughing up blood. ? Losing weight without trying. ? Lumps or other changes in your breasts. ? Vaginal bleeding, spotting, or changes in your periods. Summary  Be aware of and watch for signs and symptoms of cancer, especially symptoms of breast cancer, cervical cancer, endometrial cancer, colorectal cancer, skin cancer, and lung cancer.  Early detection of cancer with cancer screening may save your life.  Talk with your health care provider about your specific cancer risks.  Work together with your health care provider to create a cancer screening plan that is right for you. This information is not intended to replace advice given to you by your health care provider. Make sure you discuss any questions you have with your health care provider. Document Released: 09/24/2015 Document Revised: 09/24/2015 Document Reviewed: 09/24/2015 Elsevier Interactive Patient Education  [employer].  Colonoscopy, Adult A  colonoscopy is an exam to look at the entire large intestine. During the exam, a lubricated, bendable tube is inserted into the anus and then passed into the rectum, colon, and other parts of the large intestine. A colonoscopy is often done as a part of normal colorectal screening or in response to certain symptoms, such as anemia, persistent diarrhea, abdominal pain, and blood in the stool. The exam can help screen for and diagnose medical problems, including:  Tumors.  Polyps.  Inflammation.  Areas of bleeding.  Tell a health care provider about:  Any allergies you have.  All medicines you are taking, including vitamins, herbs, eye drops, creams, and over-the-counter medicines.  Any problems you or family members have had with anesthetic medicines.  Any blood disorders you have.  Any surgeries you have had.  Any medical conditions you have.  Any problems you have had passing stool. What are the risks? Generally, this is a safe procedure. However, problems may occur, including:  Bleeding.  A tear in the intestine.  A reaction to medicines given during the exam.  Infection (rare).  What happens before the procedure? Eating and drinking restrictions Follow instructions from your health care provider about eating and drinking, which may include:  A few days before the procedure - follow a low-fiber diet. Avoid nuts, seeds, dried fruit, raw fruits, and vegetables.  1-3 days before the procedure - follow a clear liquid diet. Drink only clear liquids, such as clear broth or bouillon, black coffee or tea, clear juice, clear soft drinks or sports drinks, gelatin dessert, and popsicles. Avoid any liquids that contain red or purple dye.  On the day of the procedure - do not eat or drink anything during the 2 hours before the procedure, or within the time period that your health care provider recommends.  Bowel prep If you were prescribed an oral bowel prep to clean out your  colon:  Take it as told by your health care provider. Starting the day before your procedure, you will need to drink a large amount of medicated liquid. The liquid will cause you to have multiple loose stools until your stool is almost clear or light green.  If your skin or anus gets irritated from diarrhea, you may use these to relieve the irritation: ? Medicated wipes, such as adult wet wipes with aloe and vitamin E. ? A skin soothing-product like petroleum jelly.  If you vomit while drinking the  bowel prep, take a break for up to 60 minutes and then begin the bowel prep again. If vomiting continues and you cannot take the bowel prep without vomiting, call your health care provider.  General instructions  Ask your health care provider about changing or stopping your regular medicines. This is especially important if you are taking diabetes medicines or blood thinners.  Plan to have someone take you home from the hospital or clinic. What happens during the procedure?  An IV tube may be inserted into one of your veins.  You will be given medicine to help you relax (sedative).  To reduce your risk of infection: ? Your health care team will wash or sanitize their hands. ? Your anal area will be washed with soap.  You will be asked to lie on your side with your knees bent.  Your health care provider will lubricate a long, thin, flexible tube. The tube will have a camera and a light on the end.  The tube will be inserted into your anus.  The tube will be gently eased through your rectum and colon.  Air will be delivered into your colon to keep it open. You may feel some pressure or cramping.  The camera will be used to take images during the procedure.  A small tissue sample may be removed from your body to be examined under a microscope (biopsy). If any potential problems are found, the tissue will be sent to a lab for testing.  If small polyps are found, your health care provider  may remove them and have them checked for cancer cells.  The tube that was inserted into your anus will be slowly removed. The procedure may vary among health care providers and hospitals. What happens after the procedure?  Your blood pressure, heart rate, breathing rate, and blood oxygen level will be monitored until the medicines you were given have worn off.  Do not drive for 24 hours after the exam.  You may have a small amount of blood in your stool.  You may pass gas and have mild abdominal cramping or bloating due to the air that was used to inflate your colon during the exam.  It is up to you to get the results of your procedure. Ask your health care provider, or the department performing the procedure, when your results will be ready. This information is not intended to replace advice given to you by your health care provider. Make sure you discuss any questions you have with your health care provider. Document Released: 12/25/1999 Document Revised: 10/28/2015 Document Reviewed: 03/10/2015 Elsevier Interactive Patient Education  2018 [place] Maintenance, Female Adopting a healthy lifestyle and getting preventive care can go a long way to promote health and wellness. Talk with your health care provider about what schedule of regular examinations is right for you. This is a good chance for you to check in with your provider about disease prevention and staying healthy. In between checkups, there are plenty of things you can do on your own. Experts have done a lot of research about which lifestyle changes and preventive measures are most likely to keep you healthy. Ask your health care provider for more information. Weight and diet Eat a healthy diet  Be sure to include plenty of vegetables, fruits, low-fat dairy products, and lean protein.  Do not eat a lot of foods high in solid fats, added sugars, or salt.  Get regular exercise. This is one of the most important  things you can do for your health. ? Most adults should exercise for at least 150 minutes each week. The exercise should increase your heart rate and make you sweat (moderate-intensity exercise). ? Most adults should also do strengthening exercises at least twice a week. This is in addition to the moderate-intensity exercise.  Maintain a healthy weight  Body mass index (BMI) is a measurement that can be used to identify possible weight problems. It estimates body fat based on height and weight. Your health care provider can help determine your BMI and help you achieve or maintain a healthy weight.  For females 79 years of age and older: ? A BMI below 18.5 is considered underweight. ? A BMI of 18.5 to 24.9 is normal. ? A BMI of 25 to 29.9 is considered overweight. ? A BMI of 30 and above is considered obese.  Watch levels of cholesterol and blood lipids  You should start having your blood tested for lipids and cholesterol at 74 years of age, then have this test every 5 years.  You may need to have your cholesterol levels checked more often if: ? Your lipid or cholesterol levels are high. ? You are older than 74 years of age. ? You are at high risk for heart disease.  Cancer screening Lung Cancer  Lung cancer screening is recommended for adults 14-74 years old who are at high risk for lung cancer because of a history of smoking.  A yearly low-dose CT scan of the lungs is recommended for people who: ? Currently smoke. ? Have quit within the past 15 years. ? Have at least a 30-pack-year history of smoking. A pack year is smoking an average of one pack of cigarettes a day for 1 year.  Yearly screening should continue until it has been 15 years since you quit.  Yearly screening should stop if you develop a health problem that would prevent you from having lung cancer treatment.  Breast Cancer  Practice breast self-awareness. This means understanding how your breasts normally appear  and feel.  It also means doing regular breast self-exams. Let your health care provider know about any changes, no matter how small.  If you are in your 20s or 30s, you should have a clinical breast exam (CBE) by a health care provider every 1-3 years as part of a regular health exam.  If you are 76 or older, have a CBE every year. Also consider having a breast X-ray (mammogram) every year.  If you have a family history of breast cancer, talk to your health care provider about genetic screening.  If you are at high risk for breast cancer, talk to your health care provider about having an MRI and a mammogram every year.  Breast cancer gene (BRCA) assessment is recommended for women who have family members with BRCA-related cancers. BRCA-related cancers include: ? Breast. ? Ovarian. ? Tubal. ? Peritoneal cancers.  Results of the assessment will determine the need for genetic counseling and BRCA1 and BRCA2 testing.  Cervical Cancer Your health care provider may recommend that you be screened regularly for cancer of the pelvic organs (ovaries, uterus, and vagina). This screening involves a pelvic examination, including checking for microscopic changes to the surface of your cervix (Pap test). You may be encouraged to have this screening done every 3 years, beginning at age 89.  For women ages 22-65, health care providers may recommend pelvic exams and Pap testing every 3 years, or they may recommend the Pap and  pelvic exam, combined with testing for human papilloma virus (HPV), every 5 years. Some types of HPV increase your risk of cervical cancer. Testing for HPV may also be done on women of any age with unclear Pap test results.  Other health care providers may not recommend any screening for nonpregnant women who are considered low risk for pelvic cancer and who do not have symptoms. Ask your health care provider if a screening pelvic exam is right for you.  If you have had past treatment  for cervical cancer or a condition that could lead to cancer, you need Pap tests and screening for cancer for at least 20 years after your treatment. If Pap tests have been discontinued, your risk factors (such as having a new sexual partner) need to be reassessed to determine if screening should resume. Some women have medical problems that increase the chance of getting cervical cancer. In these cases, your health care provider may recommend more frequent screening and Pap tests.  Colorectal Cancer  This type of cancer can be detected and often prevented.  Routine colorectal cancer screening usually begins at 74 years of age and continues through 74 years of age.  Your health care provider may recommend screening at an earlier age if you have risk factors for colon cancer.  Your health care provider may also recommend using home test kits to check for hidden blood in the stool.  A small camera at the end of a tube can be used to examine your colon directly (sigmoidoscopy or colonoscopy). This is done to check for the earliest forms of colorectal cancer.  Routine screening usually begins at age 17.  Direct examination of the colon should be repeated every 5-10 years through 74 years of age. However, you may need to be screened more often if early forms of precancerous polyps or small growths are found.  Skin Cancer  Check your skin from head to toe regularly.  Tell your health care provider about any new moles or changes in moles, especially if there is a change in a mole's shape or color.  Also tell your health care provider if you have a mole that is larger than the size of a pencil eraser.  Always use sunscreen. Apply sunscreen liberally and repeatedly throughout the day.  Protect yourself by wearing long sleeves, pants, a wide-brimmed hat, and sunglasses whenever you are outside.  Heart disease, diabetes, and high blood pressure  High blood pressure causes heart disease and  increases the risk of stroke. High blood pressure is more likely to develop in: ? People who have blood pressure in the high end of the normal range (130-139/85-89 mm Hg). ? People who are overweight or obese. ? People who are African American.  If you are 45-55 years of age, have your blood pressure checked every 3-5 years. If you are 36 years of age or older, have your blood pressure checked every year. You should have your blood pressure measured twice-once when you are at a hospital or clinic, and once when you are not at a hospital or clinic. Record the average of the two measurements. To check your blood pressure when you are not at a hospital or clinic, you can use: ? An automated blood pressure machine at a pharmacy. ? A home blood pressure monitor.  If you are between 56 years and 22 years old, ask your health care provider if you should take aspirin to prevent strokes.  Have regular diabetes screenings. This involves  taking a blood sample to check your fasting blood sugar level. ? If you are at a normal weight and have a low risk for diabetes, have this test once every three years after 74 years of age. ? If you are overweight and have a high risk for diabetes, consider being tested at a younger age or more often. Preventing infection Hepatitis B  If you have a higher risk for hepatitis B, you should be screened for this virus. You are considered at high risk for hepatitis B if: ? You were born in a country where hepatitis B is common. Ask your health care provider which countries are considered high risk. ? Your parents were born in a high-risk country, and you have not been immunized against hepatitis B (hepatitis B vaccine). ? You have HIV or AIDS. ? You use needles to inject street drugs. ? You live with someone who has hepatitis B. ? You have had sex with someone who has hepatitis B. ? You get hemodialysis treatment. ? You take certain medicines for conditions, including  cancer, organ transplantation, and autoimmune conditions.  Hepatitis C  Blood testing is recommended for: ? Everyone born from [birth year] through 1967. ? Anyone with known risk factors for hepatitis C.  Sexually transmitted infections (STIs)  You should be screened for sexually transmitted infections (STIs) including gonorrhea and chlamydia if: ? You are sexually active and are younger than 74 years of age. ? You are older than 74 years of age and your health care provider tells you that you are at risk for this type of infection. ? Your sexual activity has changed since you were last screened and you are at an increased risk for chlamydia or gonorrhea. Ask your health care provider if you are at risk.  If you do not have HIV, but are at risk, it may be recommended that you take a prescription medicine daily to prevent HIV infection. This is called pre-exposure prophylaxis (PrEP). You are considered at risk if: ? You are sexually active and do not regularly use condoms or know the HIV status of your partner(s). ? You take drugs by injection. ? You are sexually active with a partner who has HIV.  Talk with your health care provider about whether you are at high risk of being infected with HIV. If you choose to begin PrEP, you should first be tested for HIV. You should then be tested every 3 months for as long as you are taking PrEP. Pregnancy  If you are premenopausal and you may become pregnant, ask your health care provider about preconception counseling.  If you may become pregnant, take 400 to 800 micrograms (mcg) of folic acid every day.  If you want to prevent pregnancy, talk to your health care provider about birth control (contraception). Osteoporosis and menopause  Osteoporosis is a disease in which the bones lose minerals and strength with aging. This can result in serious bone fractures. Your risk for osteoporosis can be identified using a bone density scan.  If you are 21 years  of age or older, or if you are at risk for osteoporosis and fractures, ask your health care provider if you should be screened.  Ask your health care provider whether you should take a calcium or vitamin D supplement to lower your risk for osteoporosis.  Menopause may have certain physical symptoms and risks.  Hormone replacement therapy may reduce some of these symptoms and risks. Talk to your health care provider about whether hormone  replacement therapy is right for you. Follow these instructions at home:  Schedule regular health, dental, and eye exams.  Stay current with your immunizations.  Do not use any tobacco products including cigarettes, chewing tobacco, or electronic cigarettes.  If you are pregnant, do not drink alcohol.  If you are breastfeeding, limit how much and how often you drink alcohol.  Limit alcohol intake to no more than 1 drink per day for nonpregnant women. One drink equals 12 ounces of beer, 5 ounces of wine, or 1 ounces of hard liquor.  Do not use street drugs.  Do not share needles.  Ask your health care provider for help if you need support or information about quitting drugs.  Tell your health care provider if you often feel depressed.  Tell your health care provider if you have ever been abused or do not feel safe at home. This information is not intended to replace advice given to you by your health care provider. Make sure you discuss any questions you have with your health care provider. Document Released: 07/12/2010 Document Revised: 06/04/2015 Document Reviewed: 09/30/2014 Elsevier Interactive Patient Education  2018 [place] A mammogram is an X-ray of the breasts that is done to check for abnormal changes. This procedure can screen for and detect any changes that may suggest breast cancer. A mammogram can also identify other changes and variations in the breast, such as:  Inflammation of the breast tissue (mastitis).  An  infected area that contains a collection of pus (abscess).  A fluid-filled sac (cyst).  Fibrocystic changes. This is when breast tissue becomes denser, which can make the tissue feel rope-like or uneven under the skin.  Tumors that are not cancerous (benign).  Tell a health care provider about:  Any allergies you have.  If you have breast implants.  If you have had previous breast disease, biopsy, or surgery.  If you are breastfeeding.  Any possibility that you could be pregnant, if this applies.  If you are younger than age 51.  If you have a family history of breast cancer. What are the risks? Generally, this is a safe procedure. However, problems may occur, including:  Exposure to radiation. Radiation levels are very low with this test.  The results being misinterpreted.  The need for further tests.  The inability of the mammogram to detect certain cancers.  What happens before the procedure?  Schedule your test about 1-2 weeks after your menstrual period. This is usually when your breasts are the least tender.  If you have had a mammogram done at a different facility in the past, get the mammogram X-rays or have them sent to your current exam facility in order to compare them.  Wash your breasts and under your arms the day of the test.  Do not wear deodorants, perfumes, lotions, or powders anywhere on your body on the day of the test.  Remove any jewelry from your neck.  Wear clothes that you can change into and out of easily. What happens during the procedure?  You will undress from the waist up and put on a gown.  You will stand in front of the X-ray machine.  Each breast will be placed between two plastic or glass plates. The plates will compress your breast for a few seconds. Try to stay as relaxed as possible during the procedure. This does not cause any harm to your breasts and any discomfort you feel will be very brief.  X-rays will  be taken from  different angles of each breast. The procedure may vary among health care providers and hospitals. What happens after the procedure?  The mammogram will be examined by a specialist (radiologist).  You may need to repeat certain parts of the test, depending on the quality of the images. This is commonly done if the radiologist needs a better view of the breast tissue.  Ask when your test results will be ready. Make sure you get your test results.  You may resume your normal activities. This information is not intended to replace advice given to you by your health care provider. Make sure you discuss any questions you have with your health care provider. Document Released: 12/25/1999 Document Revised: 06/01/2015 Document Reviewed: 03/07/2014 Elsevier Interactive Patient Education  2018 [place] 65 Years and Older, Female Preventive care refers to lifestyle choices and visits with your health care provider that can promote health and wellness. What does preventive care include?  A yearly physical exam. This is also called an annual well check.  Dental exams once or twice a year.  Routine eye exams. Ask your health care provider how often you should have your eyes checked.  Personal lifestyle choices, including: ? Daily care of your teeth and gums. ? Regular physical activity. ? Eating a healthy diet. ? Avoiding tobacco and drug use. ? Limiting alcohol use. ? Practicing safe sex. ? Taking low-dose aspirin every day. ? Taking vitamin and mineral supplements as recommended by your health care provider. What happens during an annual well check? The services and screenings done by your health care provider during your annual well check will depend on your age, overall health, lifestyle risk factors, and family history of disease. Counseling Your health care provider may ask you questions about your:  Alcohol use.  Tobacco use.  Drug use.  Emotional  well-being.  Home and relationship well-being.  Sexual activity.  Eating habits.  History of falls.  Memory and ability to understand (cognition).  Work and work Statistician.  Reproductive health.  Screening You may have the following tests or measurements:  Height, weight, and BMI.  Blood pressure.  Lipid and cholesterol levels. These may be checked every 5 years, or more frequently if you are over 23 years old.  Skin check.  Lung cancer screening. You may have this screening every year starting at age 45 if you have a 30-pack-year history of smoking and currently smoke or have quit within the past 15 years.  Fecal occult blood test (FOBT) of the stool. You may have this test every year starting at age 97.  Flexible sigmoidoscopy or colonoscopy. You may have a sigmoidoscopy every 5 years or a colonoscopy every 10 years starting at age 46.  Hepatitis C blood test.  Hepatitis B blood test.  Sexually transmitted disease (STD) testing.  Diabetes screening. This is done by checking your blood sugar (glucose) after you have not eaten for a while (fasting). You may have this done every 1-3 years.  Bone density scan. This is done to screen for osteoporosis. You may have this done starting at age 52.  Mammogram. This may be done every 1-2 years. Talk to your health care provider about how often you should have regular mammograms.  Talk with your health care provider about your test results, treatment options, and if necessary, the need for more tests. Vaccines Your health care provider may recommend certain vaccines, such as:  Influenza vaccine. This is recommended every year.  Tetanus, diphtheria, and acellular pertussis (Tdap, Td) vaccine. You may need a Td booster every 10 years.  Varicella vaccine. You may need this if you have not been vaccinated.  Zoster vaccine. You may need this after age 109.  Measles, mumps, and rubella (MMR) vaccine. You may need at least  one dose of MMR if you were born in 1957 or later. You may also need a second dose.  Pneumococcal 13-valent conjugate (PCV13) vaccine. One dose is recommended after age 40.  Pneumococcal polysaccharide (PPSV23) vaccine. One dose is recommended after age 54.  Meningococcal vaccine. You may need this if you have certain conditions.  Hepatitis A vaccine. You may need this if you have certain conditions or if you travel or work in places where you may be exposed to hepatitis A.  Hepatitis B vaccine. You may need this if you have certain conditions or if you travel or work in places where you may be exposed to hepatitis B.  Haemophilus influenzae type b (Hib) vaccine. You may need this if you have certain conditions.  Talk to your health care provider about which screenings and vaccines you need and how often you need them. This information is not intended to replace advice given to you by your health care provider. Make sure you discuss any questions you have with your health care provider. Document Released: 01/23/2015 Document Revised: 09/16/2015 Document Reviewed: 10/28/2014 Elsevier Interactive Patient Education  [employer].

## 2017-05-08 NOTE — Telephone Encounter (Signed)
Happy to see her.

## 2017-05-09 ENCOUNTER — Other Ambulatory Visit: Payer: Self-pay

## 2017-05-09 DIAGNOSIS — Z1239 Encounter for other screening for malignant neoplasm of breast: Secondary | ICD-10-CM

## 2017-05-09 NOTE — Addendum Note (Signed)
Addended by: Marcell Anger E on: 05/09/2017 10:25 AM   Modules accepted: Orders

## 2017-05-10 ENCOUNTER — Other Ambulatory Visit (INDEPENDENT_AMBULATORY_CARE_PROVIDER_SITE_OTHER): Payer: PPO

## 2017-05-10 DIAGNOSIS — R252 Cramp and spasm: Secondary | ICD-10-CM | POA: Diagnosis not present

## 2017-05-10 DIAGNOSIS — Z0000 Encounter for general adult medical examination without abnormal findings: Secondary | ICD-10-CM

## 2017-05-10 LAB — URINALYSIS, ROUTINE W REFLEX MICROSCOPIC
Bilirubin Urine: NEGATIVE
Hgb urine dipstick: NEGATIVE
Ketones, ur: NEGATIVE
Nitrite: NEGATIVE
RBC / HPF: NONE SEEN (ref 0–?)
Specific Gravity, Urine: 1.015 (ref 1.000–1.030)
Total Protein, Urine: NEGATIVE
Urine Glucose: NEGATIVE
Urobilinogen, UA: 0.2 (ref 0.0–1.0)
pH: 6 (ref 5.0–8.0)

## 2017-05-10 LAB — CBC
HCT: 35.9 % — ABNORMAL LOW (ref 36.0–46.0)
Hemoglobin: 12.1 g/dL (ref 12.0–15.0)
MCHC: 33.6 g/dL (ref 30.0–36.0)
MCV: 94.1 fl (ref 78.0–100.0)
Platelets: 247 10*3/uL (ref 150.0–400.0)
RBC: 3.82 Mil/uL — ABNORMAL LOW (ref 3.87–5.11)
RDW: 14 % (ref 11.5–15.5)
WBC: 4.9 10*3/uL (ref 4.0–10.5)

## 2017-05-10 LAB — COMPREHENSIVE METABOLIC PANEL
ALT: 10 U/L (ref 0–35)
AST: 15 U/L (ref 0–37)
Albumin: 4 g/dL (ref 3.5–5.2)
Alkaline Phosphatase: 39 U/L (ref 39–117)
BUN: 17 mg/dL (ref 6–23)
CO2: 31 mEq/L (ref 19–32)
Calcium: 9.3 mg/dL (ref 8.4–10.5)
Chloride: 101 mEq/L (ref 96–112)
Creatinine, Ser: 0.71 mg/dL (ref 0.40–1.20)
GFR: 85.66 mL/min (ref >60.00)
Glucose, Bld: 90 mg/dL (ref 70–99)
Potassium: 3.9 mEq/L (ref 3.5–5.1)
Sodium: 137 mEq/L (ref 135–145)
Total Bilirubin: 0.4 mg/dL (ref 0.2–1.2)
Total Protein: 6.2 g/dL (ref 6.0–8.3)

## 2017-05-10 LAB — LIPID PANEL
Cholesterol: 218 mg/dL — ABNORMAL HIGH (ref 0–200)
HDL: 80.8 mg/dL (ref >39.00)
LDL Cholesterol: 127 mg/dL — ABNORMAL HIGH (ref 0–99)
NonHDL: 137.1
Total CHOL/HDL Ratio: 3
Triglycerides: 51 mg/dL (ref 0.0–149.0)
VLDL: 10.2 mg/dL (ref 0.0–40.0)

## 2017-05-10 LAB — MAGNESIUM: Magnesium: 1.9 mg/dL (ref 1.5–2.5)

## 2017-05-17 ENCOUNTER — Other Ambulatory Visit: Payer: Self-pay

## 2017-05-17 DIAGNOSIS — I714 Abdominal aortic aneurysm, without rupture, unspecified: Secondary | ICD-10-CM

## 2017-05-23 DIAGNOSIS — M9907 Segmental and somatic dysfunction of upper extremity: Secondary | ICD-10-CM | POA: Diagnosis not present

## 2017-05-23 DIAGNOSIS — M9904 Segmental and somatic dysfunction of sacral region: Secondary | ICD-10-CM | POA: Diagnosis not present

## 2017-05-23 DIAGNOSIS — M9903 Segmental and somatic dysfunction of lumbar region: Secondary | ICD-10-CM | POA: Diagnosis not present

## 2017-06-07 ENCOUNTER — Ambulatory Visit: Payer: PPO | Admitting: Family Medicine

## 2017-06-08 ENCOUNTER — Encounter: Payer: Self-pay | Admitting: Family Medicine

## 2017-06-14 ENCOUNTER — Ambulatory Visit (HOSPITAL_COMMUNITY)
Admission: RE | Admit: 2017-06-14 | Discharge: 2017-06-14 | Disposition: A | Discharge status: Home / Self Care | Payer: PPO | Source: Ambulatory Visit | Attending: Vascular Surgery | Admitting: Vascular Surgery

## 2017-06-14 ENCOUNTER — Encounter: Payer: Self-pay | Admitting: Vascular Surgery

## 2017-06-14 ENCOUNTER — Ambulatory Visit: Payer: PPO | Admitting: Vascular Surgery

## 2017-06-14 ENCOUNTER — Encounter

## 2017-06-14 DIAGNOSIS — I714 Abdominal aortic aneurysm, without rupture, unspecified: Secondary | ICD-10-CM

## 2017-06-14 NOTE — Progress Notes (Signed)
Requested by: Mliss Sax, MD [address] [place], [place] [address]  Reason for consultation: AAA   History of Present Illness   Raven Buchanan is a 72 y.o. (02/12/1945) female who presents with chief complaint: possible aneurysm.  By report, pt had a calcified small aortic aneurysm on a X-ray.  The patient does NOT have back or abdominal pain.  The patient notes no history of embolic episodes from the AAA.  The patient's risk factors for AAA included: none.  The patient does not smoke cigarettes.  Past Medical History:  Diagnosis Date  . Allergy   . Anemia   . Asthma   . Bladder prolapse, female, acquired 18   Seen urology  . Chicken pox   . Nephrolithiasis 2009  . Osteoporosis 11/26/2013  . Prolapsed uterus     Past Surgical History:  Procedure Laterality Date  . prolapse uterus  1970   repaired  . TONSILLECTOMY AND ADENOIDECTOMY  1949    Social History   Socioeconomic History  . Marital status: Married    Spouse name: Not on file  . Number of children: Not on file  . Years of education: Not on file  . Highest education level: Not on file  Occupational History  . Not on file  Social Needs  . Financial resource strain: Not on file  . Food insecurity:    Worry: Not on file    Inability: Not on file  . Transportation needs:    Medical: Not on file    Non-medical: Not on file  Tobacco Use  . Smoking status: Never Smoker  . Smokeless tobacco: Never Used  Substance and Sexual Activity  . Alcohol use: No  . Drug use: No  . Sexual activity: Yes    Birth control/protection: None  Lifestyle  . Physical activity:    Days per week: Not on file    Minutes per session: Not on file  . Stress: Not on file  Relationships  . Social connections:    Talks on phone: Not on file    Gets together: Not on file    Attends religious service: Not on file    Active member of club or organization: Not on file    Attends meetings of clubs or  organizations: Not on file    Relationship status: Not on file  . Intimate partner violence:    Fear of current or ex partner: Not on file    Emotionally abused: Not on file    Physically abused: Not on file    Forced sexual activity: Not on file  Other Topics Concern  . Not on file  Social History Narrative   Married, Husband Rex. 6 Children Loraine Leriche, Netta Cedars, [place] Ouch and Chelsea [place]).   MA, Retired.    Denies tobacco, alcohol or recreational drug use.   Occasional caffeine.    Uses herbal remedies, many holistic medications. Takes a daily vitamin.   Wears her seatbelt, exercises >3x week.    Smoke detector in the home.   Feels safe in her relationship.      Family History  Problem Relation Age of Onset  . Arthritis Mother   . Diabetes Mother   . Heart disease Mother   . Coronary artery disease Mother   . Diabetes Father   . Heart disease Father   . CAD Father   . Hypertension Brother     Current Outpatient Medications  Medication Sig Dispense Refill  .  fluticasone (FLONASE) 50 MCG/ACT nasal spray Place 2 sprays into both nostrils daily. 16 g 2   No current facility-administered medications for this visit.      Allergies  Allergen Reactions  . Amoxicillin   . Eggs Or Egg-Derived Products   . Erythromycin   . Influenza Vaccines   . Penicillins   . Sulfites     REVIEW OF SYSTEMS (negative unless checked):   Cardiac:  []  Chest pain or chest pressure? []  Shortness of breath upon activity? []  Shortness of breath when lying flat? []  Irregular heart rhythm?  Vascular:  []  Pain in calf, thigh, or hip brought on by walking? []  Pain in feet at night that wakes you up from your sleep? []  Blood clot in your veins? []  Leg swelling?  Pulmonary:  []  Oxygen at home? []  Productive cough? []  Wheezing?  Neurologic:  []  Sudden weakness in arms or legs? []  Sudden numbness in arms or legs? []  Sudden onset of difficult speaking or slurred speech? []  Temporary loss  of vision in one eye? []  Problems with dizziness?  Gastrointestinal:  []  Blood in stool? []  Vomited blood?  Genitourinary:  []  Burning when urinating? []  Blood in urine?  Psychiatric:  []  Major depression  Hematologic:  []  Bleeding problems? []  Problems with blood clotting?  Dermatologic:  []  Rashes or ulcers?  Constitutional:  []  Fever or chills?  Ear/Nose/Throat:  []  Change in hearing? []  Nose bleeds? []  Sore throat?  Musculoskeletal:  []  Back pain? [x]  Joint pain? []  Muscle pain?   For VQI Use Only   PRE-ADM LIVING Home  AMB STATUS Ambulatory  CAD Sx None  PRIOR CHF None  STRESS TEST No   Physical Examination     Vitals:   06/14/17 1229 06/14/17 1232  BP: (!) 148/85 128/77  Pulse: 87 84  Resp: 14   SpO2: 99%   Weight: 143 lb 3.2 oz (65 kg)   Height: 5\' 4"  (1.626 m)    Body mass index is 24.58 kg/m.  General Alert, O x 3, WD, NAD  [place]/AT,    Ear/Nose/ Throat Hearing grossly intact, nares without erythema or drainage, oropharynx without Erythema or Exudate, Mallampati score: 3,   Eyes PERRLA, EOMI,    Neck Supple, mid-line trachea,    Pulmonary Sym exp, good B air movt, CTA B  Cardiac RRR, Nl S1, S2, no Murmurs, No rubs, No S3,S4  Vascular Vessel Right Left  Radial Palpable Palpable  Brachial Palpable Palpable  Carotid Palpable, No Bruit Palpable, No Bruit  Aorta Not palpable N/A  Femoral Palpable Palpable  Popliteal Not palpable Not palpable  PT Palpable Palpable  DP Palpable Palpable    Gastro- intestinal soft, non-distended, non-tender to palpation, No guarding or rebound, no HSM, no masses, no CVAT B, No palpable prominent aortic pulse,    Musculo- skeletal M/S 5/5 throughout  , Extremities without ischemic changes  , No edema present, No visible varicosities , No Lipodermatosclerosis present  Neurologic Cranial nerves grossly intact , Pain and light touch intact in extremities , Motor exam as listed above  Psychiatric  Judgement intact, Mood & affect appropriate for pt's clinical situation  Dermatologic See M/S exam for extremity exam, No rashes otherwise noted  Lymphatic  Palpable lymph nodes: None    Non-Invasive Vascular Imaging   AAA Duplex (06/14/2017)  Max 2.20 cm x 2.20 cm  No CIA aneurysm  Triphasic flow in CIA   Outside Studies/Documentation   10 pages of outside documents were  reviewed including: outpatient chiropracter chart.   Medical Decision Making   Raven Buchanan is a [age] y.o. (1945/04/14) female who presents with: asx minimal AAA   Patient's abdominal aorta barely meets AAA criteria of >2 cm.  Her rupture risk is <1%/year.  Based on this patient's exam and diagnostic studies, she needs:  AAA q2 years AAA duplex.  The threshold for repair is AAA size > 5.5 cm, growth > 1 cm/yr, and symptomatic status.  I emphasized the importance of maximal medical management including strict control of blood pressure, blood glucose, and lipid levels, antiplatelet agents, obtaining regular exercise, and cessation of smoking.    Thank you for allowing us to participate in this patient's care.   Leonides SakeBrian Breandan People, MD, FACS [facility] Office: [phone] Pager: [phone]  06/14/2017, 9:22 AM

## 2017-07-12 DIAGNOSIS — Z8249 Family history of ischemic heart disease and other diseases of the circulatory system: Secondary | ICD-10-CM | POA: Diagnosis not present

## 2017-07-12 DIAGNOSIS — Z130 Encounter for screening for diseases of the blood and blood-forming organs and certain disorders involving the immune mechanism: Secondary | ICD-10-CM | POA: Diagnosis not present

## 2017-07-12 DIAGNOSIS — R5383 Other fatigue: Secondary | ICD-10-CM | POA: Diagnosis not present

## 2017-07-12 DIAGNOSIS — N819 Female genital prolapse, unspecified: Secondary | ICD-10-CM | POA: Diagnosis not present

## 2017-07-12 DIAGNOSIS — M79674 Pain in right toe(s): Secondary | ICD-10-CM | POA: Diagnosis not present

## 2017-07-12 DIAGNOSIS — Z13220 Encounter for screening for lipoid disorders: Secondary | ICD-10-CM | POA: Diagnosis not present

## 2017-07-12 DIAGNOSIS — Z780 Asymptomatic menopausal state: Secondary | ICD-10-CM | POA: Diagnosis not present

## 2017-07-12 DIAGNOSIS — M79675 Pain in left toe(s): Secondary | ICD-10-CM | POA: Diagnosis not present

## 2017-07-12 DIAGNOSIS — R9431 Abnormal electrocardiogram [ECG] [EKG]: Secondary | ICD-10-CM | POA: Diagnosis not present

## 2017-07-12 DIAGNOSIS — Z960 Presence of urogenital implants: Secondary | ICD-10-CM | POA: Diagnosis not present

## 2017-07-12 DIAGNOSIS — Z7689 Persons encountering health services in other specified circumstances: Secondary | ICD-10-CM | POA: Diagnosis not present

## 2017-08-01 DIAGNOSIS — N760 Acute vaginitis: Secondary | ICD-10-CM | POA: Diagnosis not present

## 2017-08-31 DIAGNOSIS — R0989 Other specified symptoms and signs involving the circulatory and respiratory systems: Secondary | ICD-10-CM | POA: Diagnosis not present

## 2017-08-31 DIAGNOSIS — I340 Nonrheumatic mitral (valve) insufficiency: Secondary | ICD-10-CM | POA: Diagnosis not present

## 2017-08-31 DIAGNOSIS — Z0189 Encounter for other specified special examinations: Secondary | ICD-10-CM | POA: Diagnosis not present

## 2017-08-31 DIAGNOSIS — R9431 Abnormal electrocardiogram [ECG] [EKG]: Secondary | ICD-10-CM | POA: Diagnosis not present

## 2017-09-04 DIAGNOSIS — R9431 Abnormal electrocardiogram [ECG] [EKG]: Secondary | ICD-10-CM | POA: Diagnosis not present

## 2017-10-10 DIAGNOSIS — R0989 Other specified symptoms and signs involving the circulatory and respiratory systems: Secondary | ICD-10-CM | POA: Diagnosis not present

## 2017-10-10 DIAGNOSIS — I340 Nonrheumatic mitral (valve) insufficiency: Secondary | ICD-10-CM | POA: Diagnosis not present

## 2017-10-27 DIAGNOSIS — I350 Nonrheumatic aortic (valve) stenosis: Secondary | ICD-10-CM | POA: Diagnosis not present

## 2017-10-27 DIAGNOSIS — R0989 Other specified symptoms and signs involving the circulatory and respiratory systems: Secondary | ICD-10-CM | POA: Diagnosis not present

## 2017-10-27 DIAGNOSIS — R9431 Abnormal electrocardiogram [ECG] [EKG]: Secondary | ICD-10-CM | POA: Diagnosis not present

## 2017-10-27 DIAGNOSIS — R9439 Abnormal result of other cardiovascular function study: Secondary | ICD-10-CM | POA: Diagnosis not present

## 2017-11-02 DIAGNOSIS — S91209A Unspecified open wound of unspecified toe(s) with damage to nail, initial encounter: Secondary | ICD-10-CM | POA: Diagnosis not present

## 2017-11-02 DIAGNOSIS — Z9109 Other allergy status, other than to drugs and biological substances: Secondary | ICD-10-CM | POA: Diagnosis not present

## 2017-11-02 DIAGNOSIS — M25512 Pain in left shoulder: Secondary | ICD-10-CM | POA: Diagnosis not present

## 2017-11-02 DIAGNOSIS — L603 Nail dystrophy: Secondary | ICD-10-CM | POA: Diagnosis not present

## 2017-11-08 DIAGNOSIS — M25312 Other instability, left shoulder: Secondary | ICD-10-CM | POA: Diagnosis not present

## 2017-11-08 DIAGNOSIS — M62512 Muscle wasting and atrophy, not elsewhere classified, left shoulder: Secondary | ICD-10-CM | POA: Diagnosis not present

## 2017-11-08 DIAGNOSIS — M25512 Pain in left shoulder: Secondary | ICD-10-CM | POA: Diagnosis not present

## 2017-11-10 DIAGNOSIS — M25312 Other instability, left shoulder: Secondary | ICD-10-CM | POA: Diagnosis not present

## 2017-11-10 DIAGNOSIS — M25512 Pain in left shoulder: Secondary | ICD-10-CM | POA: Diagnosis not present

## 2017-11-10 DIAGNOSIS — M62512 Muscle wasting and atrophy, not elsewhere classified, left shoulder: Secondary | ICD-10-CM | POA: Diagnosis not present

## 2017-11-14 DIAGNOSIS — M62512 Muscle wasting and atrophy, not elsewhere classified, left shoulder: Secondary | ICD-10-CM | POA: Diagnosis not present

## 2017-11-14 DIAGNOSIS — M25512 Pain in left shoulder: Secondary | ICD-10-CM | POA: Diagnosis not present

## 2017-11-14 DIAGNOSIS — M25312 Other instability, left shoulder: Secondary | ICD-10-CM | POA: Diagnosis not present

## 2017-11-16 DIAGNOSIS — M62512 Muscle wasting and atrophy, not elsewhere classified, left shoulder: Secondary | ICD-10-CM | POA: Diagnosis not present

## 2017-11-16 DIAGNOSIS — M25312 Other instability, left shoulder: Secondary | ICD-10-CM | POA: Diagnosis not present

## 2017-11-16 DIAGNOSIS — M25512 Pain in left shoulder: Secondary | ICD-10-CM | POA: Diagnosis not present

## 2017-11-29 DIAGNOSIS — M62512 Muscle wasting and atrophy, not elsewhere classified, left shoulder: Secondary | ICD-10-CM | POA: Diagnosis not present

## 2017-11-29 DIAGNOSIS — M25512 Pain in left shoulder: Secondary | ICD-10-CM | POA: Diagnosis not present

## 2017-11-29 DIAGNOSIS — M25312 Other instability, left shoulder: Secondary | ICD-10-CM | POA: Diagnosis not present

## 2017-12-05 DIAGNOSIS — M25512 Pain in left shoulder: Secondary | ICD-10-CM | POA: Diagnosis not present

## 2017-12-05 DIAGNOSIS — M25312 Other instability, left shoulder: Secondary | ICD-10-CM | POA: Diagnosis not present

## 2017-12-05 DIAGNOSIS — M62512 Muscle wasting and atrophy, not elsewhere classified, left shoulder: Secondary | ICD-10-CM | POA: Diagnosis not present

## 2017-12-12 DIAGNOSIS — M62512 Muscle wasting and atrophy, not elsewhere classified, left shoulder: Secondary | ICD-10-CM | POA: Diagnosis not present

## 2017-12-12 DIAGNOSIS — M25312 Other instability, left shoulder: Secondary | ICD-10-CM | POA: Diagnosis not present

## 2017-12-12 DIAGNOSIS — M25512 Pain in left shoulder: Secondary | ICD-10-CM | POA: Diagnosis not present

## 2017-12-14 DIAGNOSIS — M62512 Muscle wasting and atrophy, not elsewhere classified, left shoulder: Secondary | ICD-10-CM | POA: Diagnosis not present

## 2017-12-14 DIAGNOSIS — M25512 Pain in left shoulder: Secondary | ICD-10-CM | POA: Diagnosis not present

## 2017-12-14 DIAGNOSIS — M25312 Other instability, left shoulder: Secondary | ICD-10-CM | POA: Diagnosis not present

## 2017-12-19 DIAGNOSIS — R262 Difficulty in walking, not elsewhere classified: Secondary | ICD-10-CM | POA: Diagnosis not present

## 2017-12-19 DIAGNOSIS — M62838 Other muscle spasm: Secondary | ICD-10-CM | POA: Diagnosis not present

## 2017-12-19 DIAGNOSIS — M25551 Pain in right hip: Secondary | ICD-10-CM | POA: Diagnosis not present

## 2017-12-19 DIAGNOSIS — M25651 Stiffness of right hip, not elsewhere classified: Secondary | ICD-10-CM | POA: Diagnosis not present

## 2017-12-19 DIAGNOSIS — R531 Weakness: Secondary | ICD-10-CM | POA: Diagnosis not present

## 2017-12-21 DIAGNOSIS — M62838 Other muscle spasm: Secondary | ICD-10-CM | POA: Diagnosis not present

## 2017-12-21 DIAGNOSIS — R531 Weakness: Secondary | ICD-10-CM | POA: Diagnosis not present

## 2017-12-21 DIAGNOSIS — R262 Difficulty in walking, not elsewhere classified: Secondary | ICD-10-CM | POA: Diagnosis not present

## 2017-12-21 DIAGNOSIS — M25551 Pain in right hip: Secondary | ICD-10-CM | POA: Diagnosis not present

## 2017-12-21 DIAGNOSIS — M25651 Stiffness of right hip, not elsewhere classified: Secondary | ICD-10-CM | POA: Diagnosis not present

## 2017-12-25 DIAGNOSIS — R531 Weakness: Secondary | ICD-10-CM | POA: Diagnosis not present

## 2017-12-25 DIAGNOSIS — M25551 Pain in right hip: Secondary | ICD-10-CM | POA: Diagnosis not present

## 2017-12-25 DIAGNOSIS — R262 Difficulty in walking, not elsewhere classified: Secondary | ICD-10-CM | POA: Diagnosis not present

## 2017-12-25 DIAGNOSIS — M25651 Stiffness of right hip, not elsewhere classified: Secondary | ICD-10-CM | POA: Diagnosis not present

## 2017-12-25 DIAGNOSIS — M62838 Other muscle spasm: Secondary | ICD-10-CM | POA: Diagnosis not present

## 2017-12-28 DIAGNOSIS — M25551 Pain in right hip: Secondary | ICD-10-CM | POA: Diagnosis not present

## 2017-12-28 DIAGNOSIS — M62838 Other muscle spasm: Secondary | ICD-10-CM | POA: Diagnosis not present

## 2017-12-28 DIAGNOSIS — R531 Weakness: Secondary | ICD-10-CM | POA: Diagnosis not present

## 2017-12-28 DIAGNOSIS — M25651 Stiffness of right hip, not elsewhere classified: Secondary | ICD-10-CM | POA: Diagnosis not present

## 2017-12-28 DIAGNOSIS — R262 Difficulty in walking, not elsewhere classified: Secondary | ICD-10-CM | POA: Diagnosis not present

## 2018-01-05 DIAGNOSIS — R262 Difficulty in walking, not elsewhere classified: Secondary | ICD-10-CM | POA: Diagnosis not present

## 2018-01-05 DIAGNOSIS — R531 Weakness: Secondary | ICD-10-CM | POA: Diagnosis not present

## 2018-01-05 DIAGNOSIS — M25551 Pain in right hip: Secondary | ICD-10-CM | POA: Diagnosis not present

## 2018-01-05 DIAGNOSIS — M62838 Other muscle spasm: Secondary | ICD-10-CM | POA: Diagnosis not present

## 2018-01-05 DIAGNOSIS — M25651 Stiffness of right hip, not elsewhere classified: Secondary | ICD-10-CM | POA: Diagnosis not present

## 2018-01-11 DIAGNOSIS — M25651 Stiffness of right hip, not elsewhere classified: Secondary | ICD-10-CM | POA: Diagnosis not present

## 2018-01-11 DIAGNOSIS — R262 Difficulty in walking, not elsewhere classified: Secondary | ICD-10-CM | POA: Diagnosis not present

## 2018-01-11 DIAGNOSIS — M25551 Pain in right hip: Secondary | ICD-10-CM | POA: Diagnosis not present

## 2018-01-11 DIAGNOSIS — M62838 Other muscle spasm: Secondary | ICD-10-CM | POA: Diagnosis not present

## 2018-01-11 DIAGNOSIS — R531 Weakness: Secondary | ICD-10-CM | POA: Diagnosis not present

## 2018-01-18 DIAGNOSIS — R262 Difficulty in walking, not elsewhere classified: Secondary | ICD-10-CM | POA: Diagnosis not present

## 2018-01-18 DIAGNOSIS — M62838 Other muscle spasm: Secondary | ICD-10-CM | POA: Diagnosis not present

## 2018-01-18 DIAGNOSIS — M25551 Pain in right hip: Secondary | ICD-10-CM | POA: Diagnosis not present

## 2018-01-18 DIAGNOSIS — M25651 Stiffness of right hip, not elsewhere classified: Secondary | ICD-10-CM | POA: Diagnosis not present

## 2018-01-18 DIAGNOSIS — R531 Weakness: Secondary | ICD-10-CM | POA: Diagnosis not present

## 2018-01-22 DIAGNOSIS — R262 Difficulty in walking, not elsewhere classified: Secondary | ICD-10-CM | POA: Diagnosis not present

## 2018-01-22 DIAGNOSIS — M25651 Stiffness of right hip, not elsewhere classified: Secondary | ICD-10-CM | POA: Diagnosis not present

## 2018-01-22 DIAGNOSIS — R531 Weakness: Secondary | ICD-10-CM | POA: Diagnosis not present

## 2018-01-22 DIAGNOSIS — M62838 Other muscle spasm: Secondary | ICD-10-CM | POA: Diagnosis not present

## 2018-01-22 DIAGNOSIS — M25551 Pain in right hip: Secondary | ICD-10-CM | POA: Diagnosis not present

## 2018-01-25 DIAGNOSIS — M25651 Stiffness of right hip, not elsewhere classified: Secondary | ICD-10-CM | POA: Diagnosis not present

## 2018-01-25 DIAGNOSIS — M25551 Pain in right hip: Secondary | ICD-10-CM | POA: Diagnosis not present

## 2018-01-25 DIAGNOSIS — R262 Difficulty in walking, not elsewhere classified: Secondary | ICD-10-CM | POA: Diagnosis not present

## 2018-01-25 DIAGNOSIS — M62838 Other muscle spasm: Secondary | ICD-10-CM | POA: Diagnosis not present

## 2018-01-25 DIAGNOSIS — R531 Weakness: Secondary | ICD-10-CM | POA: Diagnosis not present

## 2018-01-29 DIAGNOSIS — M25651 Stiffness of right hip, not elsewhere classified: Secondary | ICD-10-CM | POA: Diagnosis not present

## 2018-01-29 DIAGNOSIS — M62838 Other muscle spasm: Secondary | ICD-10-CM | POA: Diagnosis not present

## 2018-01-29 DIAGNOSIS — M25551 Pain in right hip: Secondary | ICD-10-CM | POA: Diagnosis not present

## 2018-01-29 DIAGNOSIS — R531 Weakness: Secondary | ICD-10-CM | POA: Diagnosis not present

## 2018-01-29 DIAGNOSIS — R262 Difficulty in walking, not elsewhere classified: Secondary | ICD-10-CM | POA: Diagnosis not present

## 2018-02-01 DIAGNOSIS — Z9109 Other allergy status, other than to drugs and biological substances: Secondary | ICD-10-CM | POA: Diagnosis not present

## 2018-02-01 DIAGNOSIS — R9431 Abnormal electrocardiogram [ECG] [EKG]: Secondary | ICD-10-CM | POA: Diagnosis not present

## 2018-02-01 DIAGNOSIS — Z136 Encounter for screening for cardiovascular disorders: Secondary | ICD-10-CM | POA: Diagnosis not present

## 2018-02-06 DIAGNOSIS — M25651 Stiffness of right hip, not elsewhere classified: Secondary | ICD-10-CM | POA: Diagnosis not present

## 2018-02-06 DIAGNOSIS — M62838 Other muscle spasm: Secondary | ICD-10-CM | POA: Diagnosis not present

## 2018-02-06 DIAGNOSIS — M25551 Pain in right hip: Secondary | ICD-10-CM | POA: Diagnosis not present

## 2018-02-06 DIAGNOSIS — R262 Difficulty in walking, not elsewhere classified: Secondary | ICD-10-CM | POA: Diagnosis not present

## 2018-02-08 DIAGNOSIS — M25651 Stiffness of right hip, not elsewhere classified: Secondary | ICD-10-CM | POA: Diagnosis not present

## 2018-02-08 DIAGNOSIS — R262 Difficulty in walking, not elsewhere classified: Secondary | ICD-10-CM | POA: Diagnosis not present

## 2018-02-08 DIAGNOSIS — M25551 Pain in right hip: Secondary | ICD-10-CM | POA: Diagnosis not present

## 2018-02-08 DIAGNOSIS — M62838 Other muscle spasm: Secondary | ICD-10-CM | POA: Diagnosis not present

## 2018-02-13 DIAGNOSIS — M62838 Other muscle spasm: Secondary | ICD-10-CM | POA: Diagnosis not present

## 2018-02-13 DIAGNOSIS — M25651 Stiffness of right hip, not elsewhere classified: Secondary | ICD-10-CM | POA: Diagnosis not present

## 2018-02-13 DIAGNOSIS — M25551 Pain in right hip: Secondary | ICD-10-CM | POA: Diagnosis not present

## 2018-02-13 DIAGNOSIS — R262 Difficulty in walking, not elsewhere classified: Secondary | ICD-10-CM | POA: Diagnosis not present

## 2018-03-07 DIAGNOSIS — L918 Other hypertrophic disorders of the skin: Secondary | ICD-10-CM | POA: Diagnosis not present

## 2018-03-20 DIAGNOSIS — M9904 Segmental and somatic dysfunction of sacral region: Secondary | ICD-10-CM | POA: Diagnosis not present

## 2018-03-20 DIAGNOSIS — M9903 Segmental and somatic dysfunction of lumbar region: Secondary | ICD-10-CM | POA: Diagnosis not present

## 2018-03-20 DIAGNOSIS — M25551 Pain in right hip: Secondary | ICD-10-CM | POA: Diagnosis not present

## 2018-03-20 DIAGNOSIS — M9905 Segmental and somatic dysfunction of pelvic region: Secondary | ICD-10-CM | POA: Diagnosis not present

## 2018-05-03 DIAGNOSIS — Z9109 Other allergy status, other than to drugs and biological substances: Secondary | ICD-10-CM | POA: Diagnosis not present

## 2018-05-03 DIAGNOSIS — Z13220 Encounter for screening for lipoid disorders: Secondary | ICD-10-CM | POA: Diagnosis not present

## 2018-05-03 DIAGNOSIS — R9431 Abnormal electrocardiogram [ECG] [EKG]: Secondary | ICD-10-CM | POA: Diagnosis not present

## 2018-05-11 ENCOUNTER — Other Ambulatory Visit: Payer: Self-pay

## 2018-05-11 NOTE — Patient Outreach (Signed)
[facility]) Care Management  05/11/2018  SOJOURNER ASTACIO 1945/12/05 [id number]   Referral Date: 05/11/2018 Referral Source: Nurseline Referral Reason: Low blood count per physician   Outreach Attempt: No answer.  HIPAA compliant voice message left.    Plan: RN CM will attempt again within 4 business days and send letter.    Bary Leriche, RN, MSN [facility] Care Management Care Management Coordinator Direct Line [phone] Toll Free: [phone]  Fax: [phone]

## 2018-05-15 ENCOUNTER — Other Ambulatory Visit: Payer: Self-pay

## 2018-05-15 NOTE — Patient Outreach (Signed)
[facility]) Care Management  05/15/2018  DEEANNE ARTURO May 19, 1945 [id number]   Referral Date: 05/11/2018 Referral Source: Nurseline Referral Reason: Low blood count per physician   Outreach Attempt: No answer.  HIPAA compliant voice message left.    Plan: RN CM will attempt again within 4 business days.  Bary Leriche, RN, MSN [facility] Care Management Care Management Coordinator Direct Line [phone] Cell [phone] Toll Free: [phone]  Fax: [phone]

## 2018-05-16 ENCOUNTER — Other Ambulatory Visit: Payer: Self-pay

## 2018-05-16 NOTE — Patient Outreach (Signed)
[facility]) Care Management  05/16/2018  Raven Buchanan February 10, 1945 [id number]   Referral Date:05/11/2018 Referral Source:Nurseline Referral Reason:Low blood count per physician   Outreach Attempt: spoke with patient. She is able to verify HIPAA.  Discussed reason for the call. She states that her red blood cell count was low at 3.9.  She states that she has discussed with her physician and he states that right now she needs no further testing.  Patient concerned as it continues to be low.  Patient denies any problems with fatigue or pain.  Patient states that she saw her chiropractor and was recommended seeing a hematologist. Patient states that she may do that.  She asked about hematologist in the area.  Advised her that the cancer center at [facility] has hematologist and gave her the address and contact information for future reference.  Patient thankful for information and declined any further needs.    Plan: RN CM will close case.   Bary Leriche, RN, MSN [facility] Care Management Care Management Coordinator Direct Line [phone] Cell [phone] Toll Free: [phone]  Fax: [phone]

## 2018-09-25 DIAGNOSIS — M9904 Segmental and somatic dysfunction of sacral region: Secondary | ICD-10-CM | POA: Diagnosis not present

## 2018-09-25 DIAGNOSIS — M9905 Segmental and somatic dysfunction of pelvic region: Secondary | ICD-10-CM | POA: Diagnosis not present

## 2018-09-25 DIAGNOSIS — M545 Low back pain: Secondary | ICD-10-CM | POA: Diagnosis not present

## 2018-09-25 DIAGNOSIS — M9903 Segmental and somatic dysfunction of lumbar region: Secondary | ICD-10-CM | POA: Diagnosis not present

## 2018-11-02 DIAGNOSIS — M62838 Other muscle spasm: Secondary | ICD-10-CM | POA: Diagnosis not present

## 2018-11-02 DIAGNOSIS — Z1239 Encounter for other screening for malignant neoplasm of breast: Secondary | ICD-10-CM | POA: Diagnosis not present

## 2018-11-02 DIAGNOSIS — R9431 Abnormal electrocardiogram [ECG] [EKG]: Secondary | ICD-10-CM | POA: Diagnosis not present

## 2018-11-02 DIAGNOSIS — M810 Age-related osteoporosis without current pathological fracture: Secondary | ICD-10-CM | POA: Diagnosis not present

## 2018-11-02 DIAGNOSIS — I350 Nonrheumatic aortic (valve) stenosis: Secondary | ICD-10-CM | POA: Diagnosis not present

## 2018-11-02 DIAGNOSIS — Z1211 Encounter for screening for malignant neoplasm of colon: Secondary | ICD-10-CM | POA: Diagnosis not present

## 2018-11-02 DIAGNOSIS — D72819 Decreased white blood cell count, unspecified: Secondary | ICD-10-CM | POA: Diagnosis not present

## 2018-11-02 DIAGNOSIS — E7800 Pure hypercholesterolemia, unspecified: Secondary | ICD-10-CM | POA: Diagnosis not present

## 2018-11-07 ENCOUNTER — Other Ambulatory Visit: Payer: Self-pay | Admitting: Family Medicine

## 2018-11-07 DIAGNOSIS — M810 Age-related osteoporosis without current pathological fracture: Secondary | ICD-10-CM

## 2018-12-17 DIAGNOSIS — Z1211 Encounter for screening for malignant neoplasm of colon: Secondary | ICD-10-CM | POA: Diagnosis not present

## 2018-12-17 DIAGNOSIS — L989 Disorder of the skin and subcutaneous tissue, unspecified: Secondary | ICD-10-CM | POA: Diagnosis not present

## 2018-12-17 DIAGNOSIS — R450 Nervousness: Secondary | ICD-10-CM | POA: Diagnosis not present

## 2018-12-25 DIAGNOSIS — Z1211 Encounter for screening for malignant neoplasm of colon: Secondary | ICD-10-CM | POA: Diagnosis not present

## 2019-01-18 ENCOUNTER — Other Ambulatory Visit: Payer: Self-pay

## 2019-01-18 ENCOUNTER — Emergency Department (HOSPITAL_BASED_OUTPATIENT_CLINIC_OR_DEPARTMENT_OTHER): Payer: PPO

## 2019-01-18 ENCOUNTER — Encounter (HOSPITAL_BASED_OUTPATIENT_CLINIC_OR_DEPARTMENT_OTHER): Payer: Self-pay | Admitting: *Deleted

## 2019-01-18 ENCOUNTER — Emergency Department (HOSPITAL_BASED_OUTPATIENT_CLINIC_OR_DEPARTMENT_OTHER)
Admission: EM | Admit: 2019-01-18 | Discharge: 2019-01-18 | Disposition: A | Discharge status: Home / Self Care | Payer: PPO | Attending: Emergency Medicine | Admitting: Emergency Medicine

## 2019-01-18 DIAGNOSIS — Y93E9 Activity, other interior property and clothing maintenance: Secondary | ICD-10-CM | POA: Diagnosis not present

## 2019-01-18 DIAGNOSIS — M542 Cervicalgia: Secondary | ICD-10-CM | POA: Diagnosis not present

## 2019-01-18 DIAGNOSIS — Y929 Unspecified place or not applicable: Secondary | ICD-10-CM | POA: Diagnosis not present

## 2019-01-18 DIAGNOSIS — R0781 Pleurodynia: Secondary | ICD-10-CM | POA: Diagnosis not present

## 2019-01-18 DIAGNOSIS — W010XXA Fall on same level from slipping, tripping and stumbling without subsequent striking against object, initial encounter: Secondary | ICD-10-CM | POA: Insufficient documentation

## 2019-01-18 DIAGNOSIS — J45909 Unspecified asthma, uncomplicated: Secondary | ICD-10-CM | POA: Insufficient documentation

## 2019-01-18 DIAGNOSIS — S299XXA Unspecified injury of thorax, initial encounter: Secondary | ICD-10-CM | POA: Diagnosis not present

## 2019-01-18 DIAGNOSIS — W19XXXA Unspecified fall, initial encounter: Secondary | ICD-10-CM | POA: Diagnosis not present

## 2019-01-18 DIAGNOSIS — M545 Low back pain: Secondary | ICD-10-CM | POA: Insufficient documentation

## 2019-01-18 DIAGNOSIS — S199XXA Unspecified injury of neck, initial encounter: Secondary | ICD-10-CM | POA: Diagnosis not present

## 2019-01-18 DIAGNOSIS — S0990XA Unspecified injury of head, initial encounter: Secondary | ICD-10-CM

## 2019-01-18 DIAGNOSIS — Y999 Unspecified external cause status: Secondary | ICD-10-CM | POA: Insufficient documentation

## 2019-01-18 MED ORDER — TRAMADOL HCL 50 MG PO TABS: 50.0000 mg | ORAL_TABLET | Freq: Four times a day (QID) | ORAL | 0 refills | Status: DC | PRN

## 2019-01-18 MED ORDER — IBUPROFEN 800 MG PO TABS
800.0000 mg | ORAL_TABLET | Freq: Once | ORAL | Status: AC
  Administered 2019-01-18: 800 mg via ORAL
  Filled 2019-01-18: qty 1

## 2019-01-18 MED ORDER — IBUPROFEN 800 MG PO TABS: 800.0000 mg | ORAL_TABLET | Freq: Three times a day (TID) | ORAL | 0 refills | Status: DC | PRN

## 2019-01-18 NOTE — ED Provider Notes (Signed)
[facility] EMERGENCY DEPARTMENT Provider Note   CSN: [id number] Arrival date & time: 01/18/19  1751     History Chief Complaint  Patient presents with  . Fall    Raven Buchanan is a 74 y.o. female.  HPI Patient presents to the emergency department with injuries following a fall that occurred last night.  The patient states she was helping someone move and tripped over a rolled up carpet.  The patient states that she did not hit her head and has had head pain neck discomfort and posterior rib pain.  Patient states that she did not take any medications prior to arrival for symptoms.  Patient states that she did go to an urgent care and they sent her here for further evaluation and care.  The patient denies chest pain, shortness of breath, ,blurred vision, , fever, cough, weakness, numbness, dizziness, anorexia, edema, abdominal pain, nausea, vomiting, diarrhea, rash, back pain, dysuria, hematemesis, bloody stool, near syncope, or syncope.    Past Medical History:  Diagnosis Date  . Allergy   . Anemia   . Asthma   . Bladder prolapse, female, acquired 68   Seen urology  . Chicken pox   . Nephrolithiasis 2009  . Osteoporosis 11/26/2013  . Prolapsed uterus     Patient Active Problem List   Diagnosis Date Noted  . AAA (abdominal aortic aneurysm) without rupture (HCC) 06/14/2017  . Seasonal allergic rhinitis due to pollen 05/08/2017  . Heart murmur 05/08/2017  . Muscle cramps 05/08/2017  . Osteoporosis 04/22/2015  . Breast screening declined 04/07/2015  . Influenza vaccination declined by patient 04/07/2015  . Pneumococcal vaccination declined by patient 04/07/2015  . Immunization not carried out because of patient decision 04/07/2015  . Healthcare maintenance 04/07/2015    Past Surgical History:  Procedure Laterality Date  . prolapse uterus  1970   repaired  . TONSILLECTOMY AND ADENOIDECTOMY  1949     OB History   No obstetric history on file.     Family  History  Problem Relation Age of Onset  . Arthritis Mother   . Diabetes Mother   . Heart disease Mother   . Coronary artery disease Mother   . Diabetes Father   . Heart disease Father   . CAD Father   . Hypertension Brother     Social History   Tobacco Use  . Smoking status: Never Smoker  . Smokeless tobacco: Never Used  Substance Use Topics  . Alcohol use: No  . Drug use: No    Home Medications Prior to Admission medications   Medication Sig Start Date End Date Taking? Authorizing Provider  fluticasone (FLONASE) 50 MCG/ACT nasal spray Place 2 sprays into both nostrils daily. Patient not taking: Reported on 06/14/2017 05/08/17   Mliss Sax, MD    Allergies    Amoxicillin, Eggs or egg-derived products, Erythromycin, Influenza vaccines, Penicillins, and Sulfites  Review of Systems   Review of Systems All other systems negative except as documented in the HPI. All pertinent positives and negatives as reviewed in the HPI. Physical Exam Updated Vital Signs BP 126/78   Pulse 79   Temp 98.6 F (37 C) (Oral)   Resp 20   Ht 5' 4.5" (1.638 m)   Wt 59 kg   SpO2 98%   BMI 21.97 kg/m   Physical Exam Vitals and nursing note reviewed.  Constitutional:      General: She is not in acute distress.    Appearance: She is  well-developed.  HENT:     Head: Normocephalic and atraumatic.  Eyes:     Pupils: Pupils are equal, round, and reactive to light.  Cardiovascular:     Rate and Rhythm: Normal rate and regular rhythm.     Heart sounds: Normal heart sounds. No murmur. No friction rub. No gallop.   Pulmonary:     Effort: Pulmonary effort is normal. No respiratory distress.     Breath sounds: Normal breath sounds. No wheezing.  Abdominal:     General: Bowel sounds are normal. There is no distension.     Palpations: Abdomen is soft.     Tenderness: There is no abdominal tenderness.  Musculoskeletal:     Cervical back: Normal range of motion and neck supple.  Tenderness present. No swelling, deformity or rigidity. Normal range of motion.       Back:  Skin:    General: Skin is warm and dry.     Capillary Refill: Capillary refill takes less than 2 seconds.     Findings: No erythema or rash.  Neurological:     Mental Status: She is alert and oriented to person, place, and time.     Motor: No abnormal muscle tone.     Coordination: Coordination normal.  Psychiatric:        Behavior: Behavior normal.     ED Results / Procedures / Treatments   Labs (all labs ordered are listed, but only abnormal results are displayed) Labs Reviewed - No data to display  EKG None  Radiology DG Ribs Unilateral W/Chest Right  Result Date: 01/18/2019 CLINICAL DATA:  Recent fall with right-sided rib pain, initial encounter EXAM: RIGHT RIBS AND CHEST - 3+ VIEW COMPARISON:  None. FINDINGS: Cardiac shadows within normal limits. The lungs are well aerated bilaterally. No focal infiltrate or sizable effusion is seen. No pneumothorax is identified. No acute fracture is identified. IMPRESSION: No acute rib fracture seen. Electronically Signed   By: Inez Catalina M.D.   On: 01/18/2019 20:27   DG Lumbar Spine Complete  Result Date: 01/18/2019 CLINICAL DATA:  Recent fall with low back pain, initial encounter EXAM: LUMBAR SPINE - COMPLETE 4+ VIEW COMPARISON:  None. FINDINGS: Five lumbar type vertebral bodies are well visualized. Vertebral body height is well maintained. Facet hypertrophic changes are seen. Multilevel disc space narrowing is noted with osteophytic change. Mild degenerative anterolisthesis of L4 on L5 is seen. No other focal abnormality is noted. IMPRESSION: Degenerative change without acute abnormality. Electronically Signed   By: Inez Catalina M.D.   On: 01/18/2019 20:28   CT Head Wo Contrast  Result Date: 01/18/2019 CLINICAL DATA:  74 year old female with fall and trauma to the head. EXAM: CT HEAD WITHOUT CONTRAST CT CERVICAL SPINE WITHOUT CONTRAST TECHNIQUE:  Multidetector CT imaging of the head and cervical spine was performed following the standard protocol without intravenous contrast. Multiplanar CT image reconstructions of the cervical spine were also generated. COMPARISON:  None. FINDINGS: CT HEAD FINDINGS Brain: The ventricles and sulci appropriate size for patient's age. Mild periventricular and deep white matter chronic microvascular ischemic changes noted. There is no acute intracranial hemorrhage. No mass effect or midline shift. No extra-axial fluid collection. Vascular: No hyperdense vessel or unexpected calcification. Skull: Normal. Negative for fracture or focal lesion. Sinuses/Orbits: No acute finding. Other: None CT CERVICAL SPINE FINDINGS Alignment: No acute subluxation. There is reversal of normal cervical lordosis which may be positional or due to muscle spasm or secondary to degenerative changes. Skull base and vertebrae:  No acute fracture. Osteopenia. Soft tissues and spinal canal: No prevertebral fluid or swelling. No visible canal hematoma. Disc levels: Multilevel degenerative changes with endplate irregularity and disc space narrowing. Upper chest: Negative. Other: None IMPRESSION: 1. No acute intracranial hemorrhage. Mild age-related atrophy and chronic microvascular ischemic changes. 2. No acute/traumatic cervical spine pathology. Multilevel degenerative changes. Electronically Signed   By: Elgie Collard M.D.   On: 01/18/2019 20:13   CT Cervical Spine Wo Contrast  Result Date: 01/18/2019 CLINICAL DATA:  74 year old female with fall and trauma to the head. EXAM: CT HEAD WITHOUT CONTRAST CT CERVICAL SPINE WITHOUT CONTRAST TECHNIQUE: Multidetector CT imaging of the head and cervical spine was performed following the standard protocol without intravenous contrast. Multiplanar CT image reconstructions of the cervical spine were also generated. COMPARISON:  None. FINDINGS: CT HEAD FINDINGS Brain: The ventricles and sulci appropriate size for  patient's age. Mild periventricular and deep white matter chronic microvascular ischemic changes noted. There is no acute intracranial hemorrhage. No mass effect or midline shift. No extra-axial fluid collection. Vascular: No hyperdense vessel or unexpected calcification. Skull: Normal. Negative for fracture or focal lesion. Sinuses/Orbits: No acute finding. Other: None CT CERVICAL SPINE FINDINGS Alignment: No acute subluxation. There is reversal of normal cervical lordosis which may be positional or due to muscle spasm or secondary to degenerative changes. Skull base and vertebrae: No acute fracture. Osteopenia. Soft tissues and spinal canal: No prevertebral fluid or swelling. No visible canal hematoma. Disc levels: Multilevel degenerative changes with endplate irregularity and disc space narrowing. Upper chest: Negative. Other: None IMPRESSION: 1. No acute intracranial hemorrhage. Mild age-related atrophy and chronic microvascular ischemic changes. 2. No acute/traumatic cervical spine pathology. Multilevel degenerative changes. Electronically Signed   By: Elgie Collard M.D.   On: 01/18/2019 20:13    Procedures Procedures (including critical care time)  Medications Ordered in ED Medications - No data to display  ED Course  I have reviewed the triage vital signs and the nursing notes.  Pertinent labs & imaging results that were available during my care of the patient were reviewed by me and considered in my medical decision making (see chart for details).    MDM Rules/Calculators/A&P                      Patient will be treated for head injury and contusion of the ribs and other multiple contusions.  The patient is stable here in the emergency department her x-rays were reviewed and she was given the results.  Patient is advised to return for any worsening in her condition.  Patient's vital signs have remained stable here in the emergency department as well. Final Clinical Impression(s) / ED  Diagnoses Final diagnoses:  None    Rx / DC Orders ED Discharge Orders    None       Charlestine Night, Cordelia Poche 01/18/19 2152    Little, Ambrose Finland, MD 01/19/19 [phone]

## 2019-01-18 NOTE — ED Notes (Signed)
Please call daughter with test results Eloy End [phone]

## 2019-01-18 NOTE — Discharge Instructions (Addendum)
Follow-up with your primary care doctor.  Return here as needed.  Ice and heat on the areas that are sore °

## 2019-01-18 NOTE — ED Triage Notes (Signed)
Tripped over a rug yesterday and fell onto a wooden floor. She hit her head on the floor. Neck, right rib pain, and lower back pain. She was seen by her MD at [facility] today and told to come here for xrays.

## 2019-01-29 DIAGNOSIS — S060X0D Concussion without loss of consciousness, subsequent encounter: Secondary | ICD-10-CM | POA: Diagnosis not present

## 2019-01-29 DIAGNOSIS — S20219D Contusion of unspecified front wall of thorax, subsequent encounter: Secondary | ICD-10-CM | POA: Diagnosis not present

## 2019-01-29 DIAGNOSIS — S0003XD Contusion of scalp, subsequent encounter: Secondary | ICD-10-CM | POA: Diagnosis not present

## 2019-02-18 DIAGNOSIS — M50321 Other cervical disc degeneration at C4-C5 level: Secondary | ICD-10-CM | POA: Diagnosis not present

## 2019-02-18 DIAGNOSIS — M9901 Segmental and somatic dysfunction of cervical region: Secondary | ICD-10-CM | POA: Diagnosis not present

## 2019-02-18 DIAGNOSIS — M25551 Pain in right hip: Secondary | ICD-10-CM | POA: Diagnosis not present

## 2019-02-18 DIAGNOSIS — M9905 Segmental and somatic dysfunction of pelvic region: Secondary | ICD-10-CM | POA: Diagnosis not present

## 2019-02-18 DIAGNOSIS — M47812 Spondylosis without myelopathy or radiculopathy, cervical region: Secondary | ICD-10-CM | POA: Diagnosis not present

## 2019-02-18 DIAGNOSIS — M47816 Spondylosis without myelopathy or radiculopathy, lumbar region: Secondary | ICD-10-CM | POA: Diagnosis not present

## 2019-02-18 DIAGNOSIS — M47814 Spondylosis without myelopathy or radiculopathy, thoracic region: Secondary | ICD-10-CM | POA: Diagnosis not present

## 2019-02-19 DIAGNOSIS — M9905 Segmental and somatic dysfunction of pelvic region: Secondary | ICD-10-CM | POA: Diagnosis not present

## 2019-02-19 DIAGNOSIS — M25551 Pain in right hip: Secondary | ICD-10-CM | POA: Diagnosis not present

## 2019-02-19 DIAGNOSIS — M50321 Other cervical disc degeneration at C4-C5 level: Secondary | ICD-10-CM | POA: Diagnosis not present

## 2019-02-19 DIAGNOSIS — M9901 Segmental and somatic dysfunction of cervical region: Secondary | ICD-10-CM | POA: Diagnosis not present

## 2019-02-20 DIAGNOSIS — M9901 Segmental and somatic dysfunction of cervical region: Secondary | ICD-10-CM | POA: Diagnosis not present

## 2019-02-20 DIAGNOSIS — M50321 Other cervical disc degeneration at C4-C5 level: Secondary | ICD-10-CM | POA: Diagnosis not present

## 2019-02-20 DIAGNOSIS — M25551 Pain in right hip: Secondary | ICD-10-CM | POA: Diagnosis not present

## 2019-02-20 DIAGNOSIS — M9905 Segmental and somatic dysfunction of pelvic region: Secondary | ICD-10-CM | POA: Diagnosis not present

## 2019-02-25 DIAGNOSIS — M25551 Pain in right hip: Secondary | ICD-10-CM | POA: Diagnosis not present

## 2019-02-25 DIAGNOSIS — M9901 Segmental and somatic dysfunction of cervical region: Secondary | ICD-10-CM | POA: Diagnosis not present

## 2019-02-25 DIAGNOSIS — M9905 Segmental and somatic dysfunction of pelvic region: Secondary | ICD-10-CM | POA: Diagnosis not present

## 2019-02-25 DIAGNOSIS — M50321 Other cervical disc degeneration at C4-C5 level: Secondary | ICD-10-CM | POA: Diagnosis not present

## 2019-02-27 DIAGNOSIS — M50321 Other cervical disc degeneration at C4-C5 level: Secondary | ICD-10-CM | POA: Diagnosis not present

## 2019-02-27 DIAGNOSIS — M9901 Segmental and somatic dysfunction of cervical region: Secondary | ICD-10-CM | POA: Diagnosis not present

## 2019-02-27 DIAGNOSIS — M25551 Pain in right hip: Secondary | ICD-10-CM | POA: Diagnosis not present

## 2019-02-27 DIAGNOSIS — M9905 Segmental and somatic dysfunction of pelvic region: Secondary | ICD-10-CM | POA: Diagnosis not present

## 2019-03-04 DIAGNOSIS — M25551 Pain in right hip: Secondary | ICD-10-CM | POA: Diagnosis not present

## 2019-03-04 DIAGNOSIS — M9905 Segmental and somatic dysfunction of pelvic region: Secondary | ICD-10-CM | POA: Diagnosis not present

## 2019-03-04 DIAGNOSIS — M9901 Segmental and somatic dysfunction of cervical region: Secondary | ICD-10-CM | POA: Diagnosis not present

## 2019-03-04 DIAGNOSIS — M50321 Other cervical disc degeneration at C4-C5 level: Secondary | ICD-10-CM | POA: Diagnosis not present

## 2019-03-06 DIAGNOSIS — M50321 Other cervical disc degeneration at C4-C5 level: Secondary | ICD-10-CM | POA: Diagnosis not present

## 2019-03-06 DIAGNOSIS — M9905 Segmental and somatic dysfunction of pelvic region: Secondary | ICD-10-CM | POA: Diagnosis not present

## 2019-03-06 DIAGNOSIS — M9901 Segmental and somatic dysfunction of cervical region: Secondary | ICD-10-CM | POA: Diagnosis not present

## 2019-03-06 DIAGNOSIS — M25551 Pain in right hip: Secondary | ICD-10-CM | POA: Diagnosis not present

## 2019-03-07 DIAGNOSIS — M9905 Segmental and somatic dysfunction of pelvic region: Secondary | ICD-10-CM | POA: Diagnosis not present

## 2019-03-07 DIAGNOSIS — M50321 Other cervical disc degeneration at C4-C5 level: Secondary | ICD-10-CM | POA: Diagnosis not present

## 2019-03-07 DIAGNOSIS — M25551 Pain in right hip: Secondary | ICD-10-CM | POA: Diagnosis not present

## 2019-03-07 DIAGNOSIS — M9901 Segmental and somatic dysfunction of cervical region: Secondary | ICD-10-CM | POA: Diagnosis not present

## 2019-03-11 DIAGNOSIS — M9901 Segmental and somatic dysfunction of cervical region: Secondary | ICD-10-CM | POA: Diagnosis not present

## 2019-03-11 DIAGNOSIS — M25551 Pain in right hip: Secondary | ICD-10-CM | POA: Diagnosis not present

## 2019-03-11 DIAGNOSIS — M9905 Segmental and somatic dysfunction of pelvic region: Secondary | ICD-10-CM | POA: Diagnosis not present

## 2019-03-11 DIAGNOSIS — M50321 Other cervical disc degeneration at C4-C5 level: Secondary | ICD-10-CM | POA: Diagnosis not present

## 2019-03-14 DIAGNOSIS — M50321 Other cervical disc degeneration at C4-C5 level: Secondary | ICD-10-CM | POA: Diagnosis not present

## 2019-03-14 DIAGNOSIS — M9905 Segmental and somatic dysfunction of pelvic region: Secondary | ICD-10-CM | POA: Diagnosis not present

## 2019-03-14 DIAGNOSIS — M9901 Segmental and somatic dysfunction of cervical region: Secondary | ICD-10-CM | POA: Diagnosis not present

## 2019-03-14 DIAGNOSIS — M25551 Pain in right hip: Secondary | ICD-10-CM | POA: Diagnosis not present

## 2019-03-20 DIAGNOSIS — M25551 Pain in right hip: Secondary | ICD-10-CM | POA: Diagnosis not present

## 2019-03-20 DIAGNOSIS — M50321 Other cervical disc degeneration at C4-C5 level: Secondary | ICD-10-CM | POA: Diagnosis not present

## 2019-03-20 DIAGNOSIS — M9901 Segmental and somatic dysfunction of cervical region: Secondary | ICD-10-CM | POA: Diagnosis not present

## 2019-03-20 DIAGNOSIS — M9905 Segmental and somatic dysfunction of pelvic region: Secondary | ICD-10-CM | POA: Diagnosis not present

## 2019-03-25 DIAGNOSIS — M9905 Segmental and somatic dysfunction of pelvic region: Secondary | ICD-10-CM | POA: Diagnosis not present

## 2019-03-25 DIAGNOSIS — M9901 Segmental and somatic dysfunction of cervical region: Secondary | ICD-10-CM | POA: Diagnosis not present

## 2019-03-25 DIAGNOSIS — M50321 Other cervical disc degeneration at C4-C5 level: Secondary | ICD-10-CM | POA: Diagnosis not present

## 2019-03-25 DIAGNOSIS — M25551 Pain in right hip: Secondary | ICD-10-CM | POA: Diagnosis not present

## 2019-03-28 DIAGNOSIS — M9905 Segmental and somatic dysfunction of pelvic region: Secondary | ICD-10-CM | POA: Diagnosis not present

## 2019-03-28 DIAGNOSIS — M25551 Pain in right hip: Secondary | ICD-10-CM | POA: Diagnosis not present

## 2019-03-28 DIAGNOSIS — M50321 Other cervical disc degeneration at C4-C5 level: Secondary | ICD-10-CM | POA: Diagnosis not present

## 2019-03-28 DIAGNOSIS — M9901 Segmental and somatic dysfunction of cervical region: Secondary | ICD-10-CM | POA: Diagnosis not present

## 2019-04-01 DIAGNOSIS — Z7184 Encounter for health counseling related to travel: Secondary | ICD-10-CM | POA: Diagnosis not present

## 2019-04-01 DIAGNOSIS — S0003XS Contusion of scalp, sequela: Secondary | ICD-10-CM | POA: Diagnosis not present

## 2019-04-04 DIAGNOSIS — M9905 Segmental and somatic dysfunction of pelvic region: Secondary | ICD-10-CM | POA: Diagnosis not present

## 2019-04-04 DIAGNOSIS — M50321 Other cervical disc degeneration at C4-C5 level: Secondary | ICD-10-CM | POA: Diagnosis not present

## 2019-04-04 DIAGNOSIS — M9901 Segmental and somatic dysfunction of cervical region: Secondary | ICD-10-CM | POA: Diagnosis not present

## 2019-04-04 DIAGNOSIS — M25551 Pain in right hip: Secondary | ICD-10-CM | POA: Diagnosis not present

## 2019-04-17 DIAGNOSIS — M9901 Segmental and somatic dysfunction of cervical region: Secondary | ICD-10-CM | POA: Diagnosis not present

## 2019-04-17 DIAGNOSIS — M25551 Pain in right hip: Secondary | ICD-10-CM | POA: Diagnosis not present

## 2019-04-17 DIAGNOSIS — M9905 Segmental and somatic dysfunction of pelvic region: Secondary | ICD-10-CM | POA: Diagnosis not present

## 2019-04-17 DIAGNOSIS — M50321 Other cervical disc degeneration at C4-C5 level: Secondary | ICD-10-CM | POA: Diagnosis not present

## 2019-05-09 DIAGNOSIS — S060X0D Concussion without loss of consciousness, subsequent encounter: Secondary | ICD-10-CM | POA: Diagnosis not present

## 2019-05-09 DIAGNOSIS — M810 Age-related osteoporosis without current pathological fracture: Secondary | ICD-10-CM | POA: Diagnosis not present

## 2019-05-09 DIAGNOSIS — E673 Hypervitaminosis D: Secondary | ICD-10-CM | POA: Diagnosis not present

## 2019-05-09 DIAGNOSIS — F0781 Postconcussional syndrome: Secondary | ICD-10-CM | POA: Diagnosis not present

## 2019-06-27 DIAGNOSIS — Z20822 Contact with and (suspected) exposure to covid-19: Secondary | ICD-10-CM | POA: Diagnosis not present

## 2019-06-27 DIAGNOSIS — J302 Other seasonal allergic rhinitis: Secondary | ICD-10-CM | POA: Diagnosis not present

## 2019-06-27 DIAGNOSIS — H04129 Dry eye syndrome of unspecified lacrimal gland: Secondary | ICD-10-CM | POA: Diagnosis not present

## 2019-06-27 DIAGNOSIS — R0789 Other chest pain: Secondary | ICD-10-CM | POA: Diagnosis not present

## 2019-09-03 DIAGNOSIS — N819 Female genital prolapse, unspecified: Secondary | ICD-10-CM | POA: Diagnosis not present

## 2019-09-03 DIAGNOSIS — N760 Acute vaginitis: Secondary | ICD-10-CM | POA: Diagnosis not present

## 2019-09-13 DIAGNOSIS — R895 Abnormal microbiological findings in specimens from other organs, systems and tissues: Secondary | ICD-10-CM | POA: Diagnosis not present

## 2019-09-13 DIAGNOSIS — N819 Female genital prolapse, unspecified: Secondary | ICD-10-CM | POA: Diagnosis not present

## 2019-09-18 DIAGNOSIS — N819 Female genital prolapse, unspecified: Secondary | ICD-10-CM | POA: Diagnosis not present

## 2019-09-18 DIAGNOSIS — N8182 Incompetence or weakening of pubocervical tissue: Secondary | ICD-10-CM | POA: Diagnosis not present

## 2019-10-22 DIAGNOSIS — M6281 Muscle weakness (generalized): Secondary | ICD-10-CM | POA: Diagnosis not present

## 2019-10-22 DIAGNOSIS — N8110 Cystocele, unspecified: Secondary | ICD-10-CM | POA: Diagnosis not present

## 2019-10-22 DIAGNOSIS — M6289 Other specified disorders of muscle: Secondary | ICD-10-CM | POA: Diagnosis not present

## 2019-10-22 DIAGNOSIS — M62838 Other muscle spasm: Secondary | ICD-10-CM | POA: Diagnosis not present

## 2019-10-22 DIAGNOSIS — N393 Stress incontinence (female) (male): Secondary | ICD-10-CM | POA: Diagnosis not present

## 2019-10-31 DIAGNOSIS — N393 Stress incontinence (female) (male): Secondary | ICD-10-CM | POA: Diagnosis not present

## 2019-10-31 DIAGNOSIS — M62838 Other muscle spasm: Secondary | ICD-10-CM | POA: Diagnosis not present

## 2019-10-31 DIAGNOSIS — M6281 Muscle weakness (generalized): Secondary | ICD-10-CM | POA: Diagnosis not present

## 2019-10-31 DIAGNOSIS — M6289 Other specified disorders of muscle: Secondary | ICD-10-CM | POA: Diagnosis not present

## 2019-11-06 DIAGNOSIS — M6281 Muscle weakness (generalized): Secondary | ICD-10-CM | POA: Diagnosis not present

## 2019-11-06 DIAGNOSIS — N393 Stress incontinence (female) (male): Secondary | ICD-10-CM | POA: Diagnosis not present

## 2019-11-06 DIAGNOSIS — M6289 Other specified disorders of muscle: Secondary | ICD-10-CM | POA: Diagnosis not present

## 2019-11-06 DIAGNOSIS — M62838 Other muscle spasm: Secondary | ICD-10-CM | POA: Diagnosis not present

## 2019-11-20 DIAGNOSIS — N393 Stress incontinence (female) (male): Secondary | ICD-10-CM | POA: Diagnosis not present

## 2019-11-20 DIAGNOSIS — M62838 Other muscle spasm: Secondary | ICD-10-CM | POA: Diagnosis not present

## 2019-11-20 DIAGNOSIS — N8110 Cystocele, unspecified: Secondary | ICD-10-CM | POA: Diagnosis not present

## 2019-11-20 DIAGNOSIS — M6289 Other specified disorders of muscle: Secondary | ICD-10-CM | POA: Diagnosis not present

## 2019-11-20 DIAGNOSIS — M6281 Muscle weakness (generalized): Secondary | ICD-10-CM | POA: Diagnosis not present

## 2019-12-23 DIAGNOSIS — M62838 Other muscle spasm: Secondary | ICD-10-CM | POA: Diagnosis not present

## 2019-12-23 DIAGNOSIS — M6289 Other specified disorders of muscle: Secondary | ICD-10-CM | POA: Diagnosis not present

## 2019-12-23 DIAGNOSIS — N393 Stress incontinence (female) (male): Secondary | ICD-10-CM | POA: Diagnosis not present

## 2019-12-23 DIAGNOSIS — M6281 Muscle weakness (generalized): Secondary | ICD-10-CM | POA: Diagnosis not present

## 2019-12-23 DIAGNOSIS — N8110 Cystocele, unspecified: Secondary | ICD-10-CM | POA: Diagnosis not present

## 2020-01-02 DIAGNOSIS — H5789 Other specified disorders of eye and adnexa: Secondary | ICD-10-CM | POA: Diagnosis not present

## 2020-01-03 ENCOUNTER — Emergency Department (HOSPITAL_BASED_OUTPATIENT_CLINIC_OR_DEPARTMENT_OTHER)
Admission: EM | Admit: 2020-01-03 | Discharge: 2020-01-03 | Disposition: A | Discharge status: Home / Self Care | Payer: PPO | Attending: Emergency Medicine | Admitting: Emergency Medicine

## 2020-01-03 ENCOUNTER — Encounter (HOSPITAL_BASED_OUTPATIENT_CLINIC_OR_DEPARTMENT_OTHER): Payer: Self-pay

## 2020-01-03 ENCOUNTER — Other Ambulatory Visit: Payer: Self-pay

## 2020-01-03 DIAGNOSIS — J45909 Unspecified asthma, uncomplicated: Secondary | ICD-10-CM | POA: Diagnosis not present

## 2020-01-03 DIAGNOSIS — S0592XA Unspecified injury of left eye and orbit, initial encounter: Secondary | ICD-10-CM | POA: Diagnosis present

## 2020-01-03 DIAGNOSIS — S0502XA Injury of conjunctiva and corneal abrasion without foreign body, left eye, initial encounter: Secondary | ICD-10-CM | POA: Diagnosis not present

## 2020-01-03 DIAGNOSIS — X58XXXA Exposure to other specified factors, initial encounter: Secondary | ICD-10-CM | POA: Insufficient documentation

## 2020-01-03 MED ORDER — TETRACAINE HCL 0.5 % OP SOLN
2.0000 [drp] | Freq: Once | OPHTHALMIC | Status: AC
  Administered 2020-01-03: 2 [drp] via OPHTHALMIC
  Filled 2020-01-03: qty 4

## 2020-01-03 MED ORDER — CYCLOPENTOLATE HCL 1 % OP SOLN
1.0000 [drp] | Freq: Once | OPHTHALMIC | Status: AC
  Administered 2020-01-03: 1 [drp] via OPHTHALMIC
  Filled 2020-01-03: qty 2

## 2020-01-03 MED ORDER — CIPROFLOXACIN HCL 0.3 % OP SOLN: OPHTHALMIC | 0 refills | Status: DC

## 2020-01-03 MED ORDER — CIPROFLOXACIN HCL 0.3 % OP OINT: TOPICAL_OINTMENT | OPHTHALMIC | 0 refills | Status: DC

## 2020-01-03 MED ORDER — FLUORESCEIN SODIUM 1 MG OP STRP
ORAL_STRIP | OPHTHALMIC | Status: AC
  Administered 2020-01-03: 1
  Filled 2020-01-03: qty 1

## 2020-01-03 NOTE — Discharge Instructions (Addendum)
You are seen today for corneal abrasion, we are also concerned that you are starting to develop a corneal ulcer.  Please take the antibiotics as directed, stop using the prednisone.  Stop wearing her contacts.  Please follow-up with an ophthalmologist as soon as possible preferably in the next day.  If you have any new or worsening concerning symptoms come back to the emergency department.  Please speak to your pharmacist about side effects or interactions of medications prescribed today.

## 2020-01-03 NOTE — ED Notes (Signed)
ED Provider at bedside. 

## 2020-01-03 NOTE — ED Provider Notes (Signed)
[facility] EMERGENCY DEPARTMENT Provider Note   CSN: [id number] Arrival date & time: 01/03/20  1132     History Chief Complaint  Patient presents with  . Eye Problem    Raven Buchanan is a 74 y.o. female with pertinent past medical history of asthma, osteoporosis that presents to the emergency department today for left eye pain.  Patient states that she has been having left eye pain for the past week, started on Sunday specifically.  At that time she called her ophthalmologist who placed her on steroid drops for her eye, did not see her opthomologist.  Patient states that she normally sleeps with contacts in at night, wears reading glasses during the day.  Ophthalmologist lives in [place] who recommended the prednisone drops.  Patient states that this has been helping, however on Tuesday she noticed that she started having drainage in her eyes started turning red.  Went to her PCP yesterday who ordered her some antibiotic drops for her eye, states that she was not able to fill these do to the pharmacy not having the medication at the time.  Patient called her PCP again today who told her to come here for further evaluation.  Patient states that she started having some blurry vision of that eye this morning, feels as if there is a film over her vision.  Denies seeing any spots, spider webs, halos.  Denies any vision loss.  States that the top of her eye is painful rather than inside her eye.  Not painful when she moves her eye.  Denies any sick contacts.  Denies any fevers, congestion, sinus pain.  States that she has noticed more clear drainage from her eye.  Also admitting to some photophobia, closing her eye makes it feel better. Foreign body sensation present. HPI     Past Medical History:  Diagnosis Date  . Allergy   . Anemia   . Asthma   . Bladder prolapse, female, acquired 43   Seen urology  . Chicken pox   . Nephrolithiasis 2009  . Osteoporosis 11/26/2013  . Prolapsed  uterus     Patient Active Problem List   Diagnosis Date Noted  . AAA (abdominal aortic aneurysm) without rupture (HCC) 06/14/2017  . Seasonal allergic rhinitis due to pollen 05/08/2017  . Heart murmur 05/08/2017  . Muscle cramps 05/08/2017  . Osteoporosis 04/22/2015  . Breast screening declined 04/07/2015  . Influenza vaccination declined by patient 04/07/2015  . Pneumococcal vaccination declined by patient 04/07/2015  . Immunization not carried out because of patient decision 04/07/2015  . Healthcare maintenance 04/07/2015    Past Surgical History:  Procedure Laterality Date  . prolapse uterus  1970   repaired  . TONSILLECTOMY AND ADENOIDECTOMY  1949     OB History   No obstetric history on file.     Family History  Problem Relation Age of Onset  . Arthritis Mother   . Diabetes Mother   . Heart disease Mother   . Coronary artery disease Mother   . Diabetes Father   . Heart disease Father   . CAD Father   . Hypertension Brother     Social History   Tobacco Use  . Smoking status: Never Smoker  . Smokeless tobacco: Never Used  Substance Use Topics  . Alcohol use: No  . Drug use: No    Home Medications Prior to Admission medications   Medication Sig Start Date End Date Taking? Authorizing Provider  ciprofloxacin (CILOXAN) 0.3 %  ophthalmic ointment Half an inch of the ribbon four times a day for 5 days 01/03/20   Farrel Gordon, PA-C  fluticasone [facility]) 50 MCG/ACT nasal spray Place 2 sprays into both nostrils daily. Patient not taking: Reported on 06/14/2017 05/08/17   Mliss Sax, MD  ibuprofen (ADVIL) 800 MG tablet Take 1 tablet (800 mg total) by mouth every 8 (eight) hours as needed. 01/18/19   Lawyer, Cristal Deer, PA-C  traMADol (ULTRAM) 50 MG tablet Take 1 tablet (50 mg total) by mouth every 6 (six) hours as needed for severe pain. 01/18/19   Lawyer, Cristal Deer, PA-C    Allergies    Amoxicillin, Eggs or egg-derived products, Erythromycin,  Influenza vaccines, Penicillins, and Sulfites  Review of Systems   Review of Systems  Constitutional: Negative for chills, diaphoresis, fatigue and fever.  HENT: Negative for congestion, sore throat and trouble swallowing.   Eyes: Positive for photophobia, pain, discharge, redness and visual disturbance.  Respiratory: Negative for cough, shortness of breath and wheezing.   Cardiovascular: Negative for chest pain, palpitations and leg swelling.  Gastrointestinal: Negative for abdominal distention, abdominal pain, diarrhea, nausea and vomiting.  Genitourinary: Negative for difficulty urinating.  Musculoskeletal: Negative for back pain, neck pain and neck stiffness.  Skin: Negative for pallor.  Neurological: Negative for dizziness, speech difficulty, weakness and headaches.  Psychiatric/Behavioral: Negative for confusion.    Physical Exam Updated Vital Signs BP (!) 144/86   Pulse 76   Temp 98 F (36.7 C) (Oral)   Resp 18   Ht 5\' 4"  (1.626 m)   Wt 65.8 kg   SpO2 97%   BMI 24.89 kg/m   Physical Exam Constitutional:      General: She is not in acute distress.    Appearance: Normal appearance. She is not ill-appearing, toxic-appearing or diaphoretic.  Eyes:     General: Lids are normal. Lids are everted, no foreign bodies appreciated. Vision grossly intact. Gaze aligned appropriately. No visual field deficit or scleral icterus.       Left eye: Discharge present.    Extraocular Movements: Extraocular movements intact.     Left eye: Normal extraocular motion and no nystagmus.     Conjunctiva/sclera:     Left eye: Left conjunctiva is injected.     Pupils: Pupils are equal, round, and reactive to light.     Comments: Patient with injected left conjunctiva, upper eye edema noted on upper eyelid.  PERRLA, normal EOMs.  Clear discharge noted to left eye.  On fluorescein exam there was small uptake noted to iris, does appear slightly hazy.  Concern for corneal abrasion, and questionable  corneal ulcer.  Tonometer is not working at this time, however no concerns for acute angle closure glaucoma.  Cardiovascular:     Rate and Rhythm: Normal rate and regular rhythm.     Pulses: Normal pulses.  Pulmonary:     Effort: Pulmonary effort is normal.     Breath sounds: Normal breath sounds.  Musculoskeletal:        General: Normal range of motion.  Skin:    General: Skin is warm and dry.     Capillary Refill: Capillary refill takes less than 2 seconds.  Neurological:     General: No focal deficit present.     Mental Status: She is alert and oriented to person, place, and time.  Psychiatric:        Mood and Affect: Mood normal.        Behavior: Behavior normal.  Thought Content: Thought content normal.     ED Results / Procedures / Treatments   Labs (all labs ordered are listed, but only abnormal results are displayed) Labs Reviewed - No data to display  EKG None  Radiology No results found.  Procedures Procedures (including critical care time)  Medications Ordered in ED Medications  tetracaine (PONTOCAINE) 0.5 % ophthalmic solution 2 drop (2 drops Both Eyes Given by Other 01/03/20 1340)  fluorescein 1 MG ophthalmic strip (1 strip  Given by Other 01/03/20 1343)  cyclopentolate (CYCLODRYL,CYCLOGYL) 1 % ophthalmic solution 1 drop (1 drop Left Eye Given by Other 01/03/20 1404)    ED Course  I have reviewed the triage vital signs and the nursing notes.  Pertinent labs & imaging results that were available during my care of the patient were reviewed by me and considered in my medical decision making (see chart for details).    MDM Rules/Calculators/A&P                         Raven Buchanan is a 74 y.o. female with remote medical history of asthma, osteoporosis that presents to the emergency department today for left eye pain.  Physical exam with corneal abrasion, questionable begining of corneal ulcer, no true ulcer forming yet.  Visual acuity worse on left  eye, most likely due to blurry vision from corneal abrasion.  No true vision loss.  Dr. Particia Nearing there during physical exam. Symptomatic treatment discussed including discontinuing prednisone.  Will give Cipro eyedrops and have patient follow-up with ophthalmology urgently. Pt expressed understanding. Do not think we need emergent opthalmology services at this time, however did express importance of following up with opthalmology in a timely manner.   Doubt need for further emergent work up at this time. I explained the diagnosis and have given explicit precautions to return to the ER including for any other new or worsening symptoms. The patient understands and accepts the medical plan as it's been dictated and I have answered their questions. Discharge instructions concerning home care and prescriptions have been given. The patient is STABLE and is discharged to home in good condition.  I discussed this case with my attending physician who cosigned this note including patient's presenting symptoms, physical exam, and planned diagnostics and interventions. Attending physician stated agreement with plan or made changes to plan which were implemented.   Final Clinical Impression(s) / ED Diagnoses Final diagnoses:  Abrasion of left cornea, initial encounter    Rx / DC Orders ED Discharge Orders         Ordered    ciprofloxacin (CILOXAN) 0.3 % ophthalmic ointment        01/03/20 1354           Farrel Gordon, PA-C 01/03/20 1606    Jacalyn Lefevre, MD 01/13/20 [phone]

## 2020-01-03 NOTE — ED Triage Notes (Signed)
Pt reports her eye doctor is in [place] reports that she was recently placed on an antibiotics for her eye discomfort that started Sunday but reports that starting Tuesday she noticed she was having more pain and drainage, PCP here called in antibiotic cream for her left eye which the pharmacy did not have. Pt reports she feels that she has a film over her vision now.

## 2020-01-07 DIAGNOSIS — H25013 Cortical age-related cataract, bilateral: Secondary | ICD-10-CM | POA: Diagnosis not present

## 2020-01-07 DIAGNOSIS — H2513 Age-related nuclear cataract, bilateral: Secondary | ICD-10-CM | POA: Diagnosis not present

## 2020-01-07 DIAGNOSIS — S0502XA Injury of conjunctiva and corneal abrasion without foreign body, left eye, initial encounter: Secondary | ICD-10-CM | POA: Diagnosis not present

## 2020-01-07 DIAGNOSIS — H25043 Posterior subcapsular polar age-related cataract, bilateral: Secondary | ICD-10-CM | POA: Diagnosis not present

## 2020-01-08 DIAGNOSIS — S0502XA Injury of conjunctiva and corneal abrasion without foreign body, left eye, initial encounter: Secondary | ICD-10-CM | POA: Diagnosis not present

## 2020-09-16 ENCOUNTER — Other Ambulatory Visit: Payer: Self-pay | Admitting: Family Medicine

## 2020-09-16 DIAGNOSIS — R51 Headache with orthostatic component, not elsewhere classified: Secondary | ICD-10-CM

## 2020-09-18 IMAGING — DX DG LUMBAR SPINE COMPLETE 4+V
5 series · 5 of 5 positions shown · non-contrast
Comparison: None.

CLINICAL DATA: Recent fall with low back pain, initial encounter

EXAM:
LUMBAR SPINE - COMPLETE 4+ VIEW

[l-spine ap]
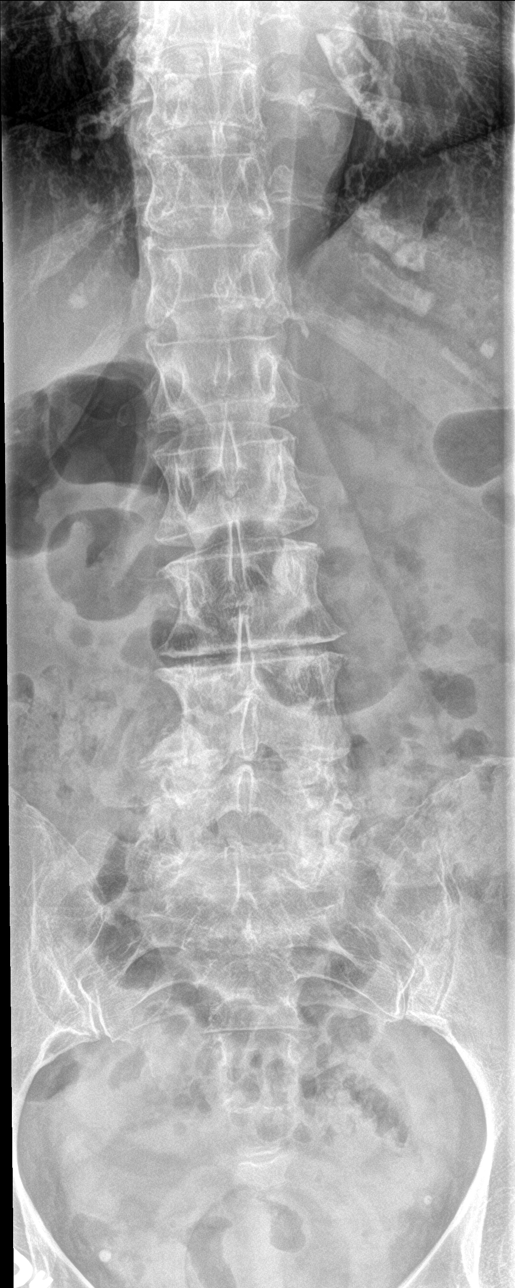

[l-spine obl (1 of 2)]
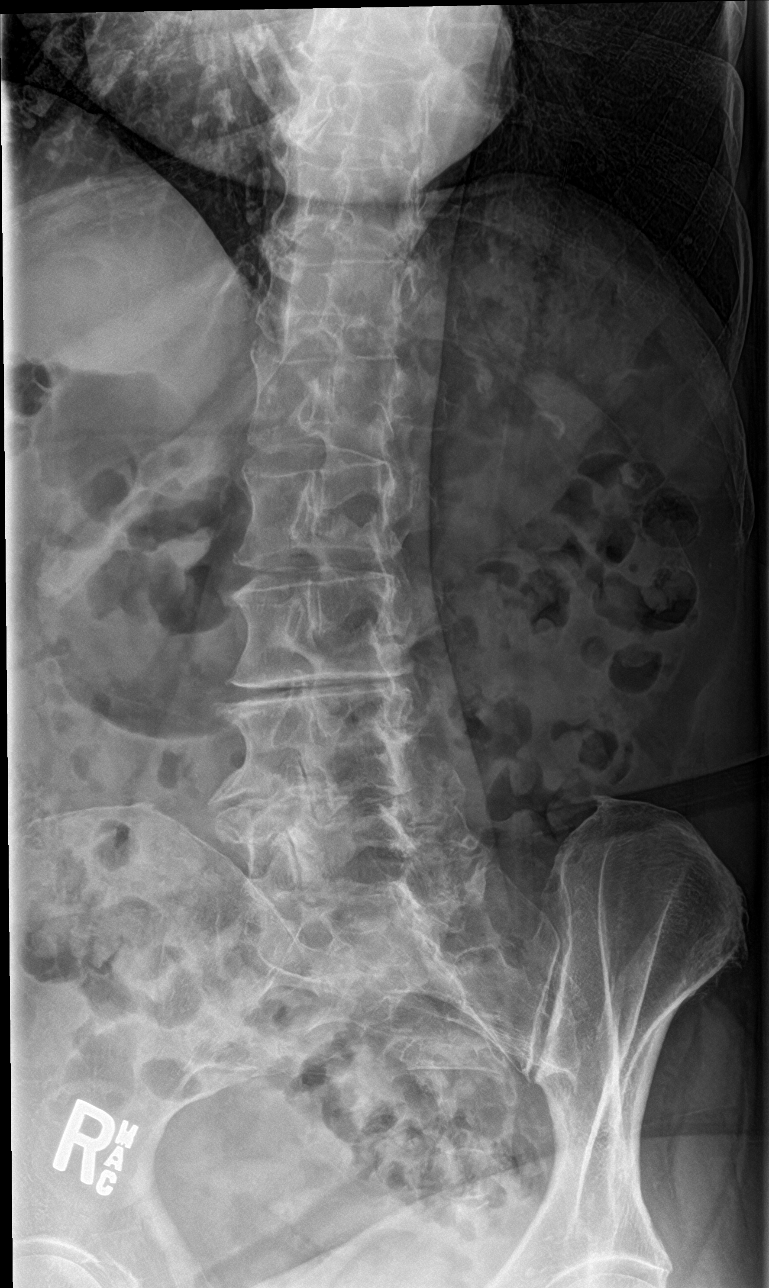

[l-spine obl (2 of 2)]
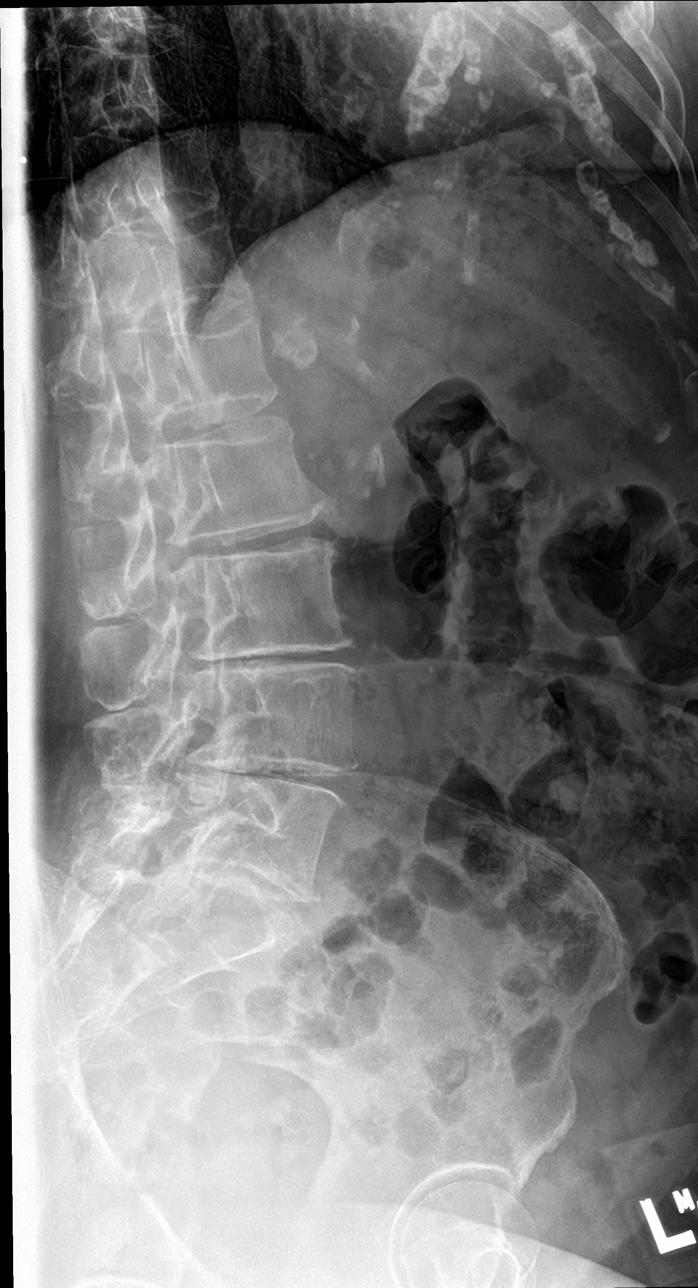

[l-spine lat]
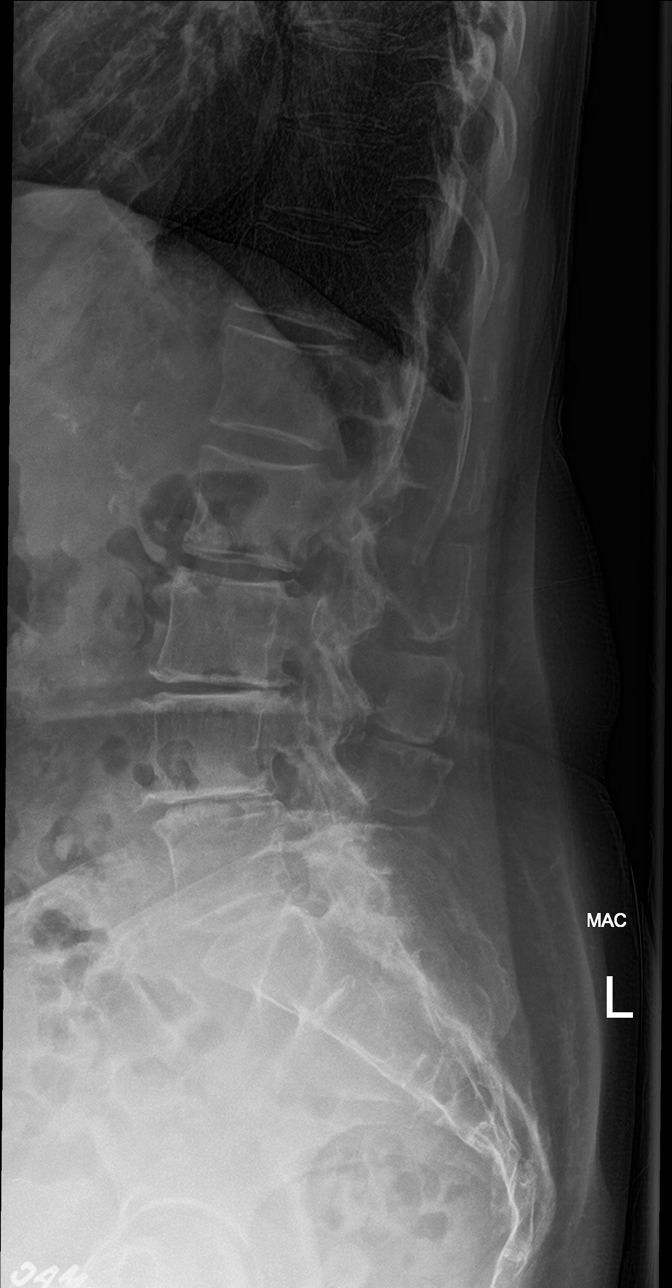

[l-spine spot]
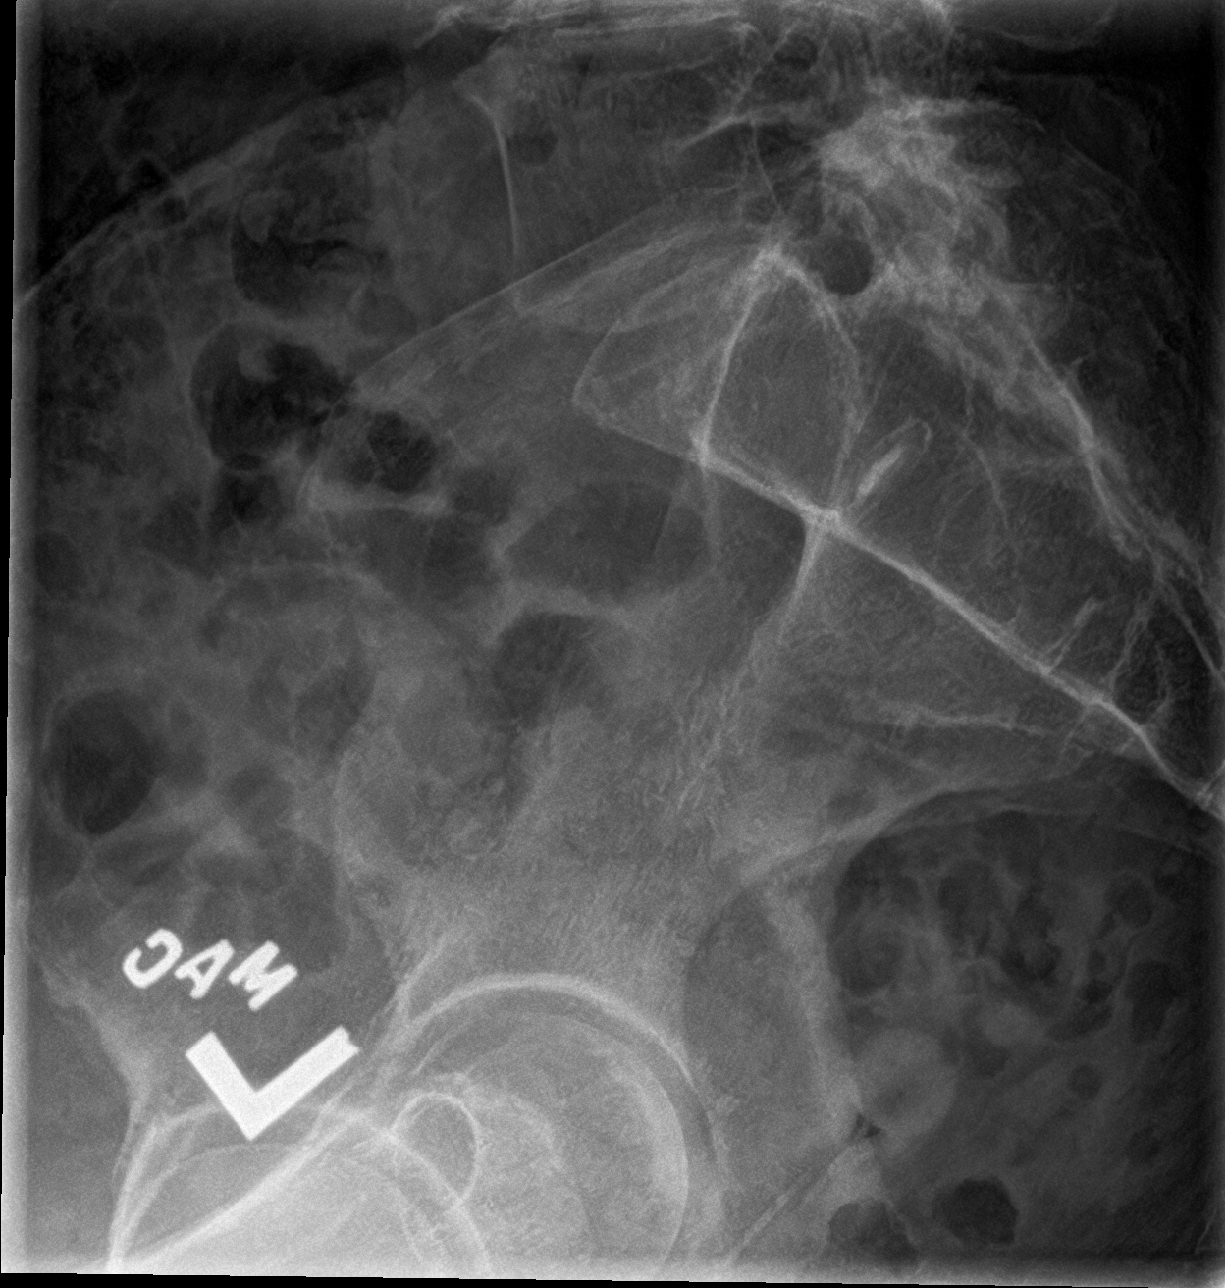

[5 of 5 positions shown; findings below may reference images not displayed]

FINDINGS: Five lumbar type vertebral bodies are well visualized. Vertebral
body height is well maintained. Facet hypertrophic changes are seen.
Multilevel disc space narrowing is noted with osteophytic change.
Mild degenerative anterolisthesis of L4 on L5 is seen. No other
focal abnormality is noted.
IMPRESSION: Degenerative change without acute abnormality.

## 2020-09-21 ENCOUNTER — Ambulatory Visit
Admission: RE | Admit: 2020-09-21 | Discharge: 2020-09-21 | Disposition: A | Discharge status: Home / Self Care | Payer: Medicare (Managed Care) | Source: Ambulatory Visit | Attending: Family Medicine | Admitting: Family Medicine

## 2020-09-21 DIAGNOSIS — R51 Headache with orthostatic component, not elsewhere classified: Secondary | ICD-10-CM

## 2020-12-29 ENCOUNTER — Ambulatory Visit (INDEPENDENT_AMBULATORY_CARE_PROVIDER_SITE_OTHER): Payer: Medicare (Managed Care) | Admitting: Allergy & Immunology

## 2020-12-29 ENCOUNTER — Other Ambulatory Visit: Payer: Self-pay

## 2020-12-29 ENCOUNTER — Encounter: Payer: Self-pay | Admitting: Allergy & Immunology

## 2020-12-29 VITALS — HR 87 | Temp 97.9°F | Ht 64.5 in | Wt 154.6 lb

## 2020-12-29 DIAGNOSIS — L239 Allergic contact dermatitis, unspecified cause: Secondary | ICD-10-CM

## 2020-12-29 DIAGNOSIS — J310 Chronic rhinitis: Secondary | ICD-10-CM | POA: Diagnosis not present

## 2020-12-29 DIAGNOSIS — J4540 Moderate persistent asthma, uncomplicated: Secondary | ICD-10-CM

## 2020-12-29 DIAGNOSIS — T7840XA Allergy, unspecified, initial encounter: Secondary | ICD-10-CM

## 2020-12-29 DIAGNOSIS — T7800XD Anaphylactic reaction due to unspecified food, subsequent encounter: Secondary | ICD-10-CM

## 2020-12-29 NOTE — Progress Notes (Signed)
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 Mild intermittent asthma, uncomplicated - Lung testing looks slightly low, but it improved with the albuterol treatment. - We are going to start an inhaled steroid to see if this provides any relief of your symptoms (inhaled steroids help with decreasing inflammation as well as decreasing mucus production).  - We are going to send in albuterol to use 2 to 4 puffs every 4-6 hours as needed for shortness of breath/wheezing. - Spacer use reviewed. - Daily controller medication(s): Trelegy 100/62.5/25 one puff once daily - Prior to physical activity: albuterol 2 puffs 10-15 minutes before physical activity. - Rescue medications: albuterol 4 puffs every 4-6 hours as needed - Asthma control goals:  * Full participation in all desired activities (may need albuterol before activity) * Albuterol use two time or less a week on average (not counting use with activity) * Cough interfering with sleep two time or less a month * Oral steroids no more than once a year * No hospitalizations  2. Chronic rhinitis - Testing today showed: outdoor molds. - Copy of test results provided.  - Avoidance measures provided. - Start with: Zyrtec (cetirizine)  tablet once daily  3. Allergic contact dermatitis - Try to get a sample of that tooth material they will be using at the dentist office. - We can do patch testing on your back to check to see if you have contact dermatitis from this material. - These tests are placed on a Monday and read on a Wednesday and  Friday.  4. Anaphylactic shock due to food (seafood, okra) - Testing was negative to the most common foods, including seafood (and oysters). - We are going to get some blood work to confirm this.  - I do not think that we have an okra IgE (allergy) test via the blood. - But we will see what the seafood testing shows first and then if something is positive there, I would recommend introducing okra again at home.   5. Return in about 4 weeks (around 01/26/2021).   This note in its entirety was forwarded to the Provider who requested this consultation.  Subjective:   Raven Buchanan is a 75 y.o. female presenting today for evaluation of  Chief Complaint  Patient presents with   Angioedema   Allergic Rhinitis    Allergic Reaction    Shellfish gumbo - oyster never had it before,  oka causes rashes, clams, and shrimp. Lips, tongue swelling.  Did not go got the emergency room and later went to her pcp and was given an epipen.    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 and     Raven Buchanan has a history of the following: Patient Active Problem List   Diagnosis Date Noted   AAA (abdominal aortic aneurysm) without rupture 06/14/2017   Seasonal allergic rhinitis due to pollen 05/08/2017   Heart murmur 05/08/2017   Muscle cramps 05/08/2017   Osteoporosis 04/22/2015   Breast screening declined 04/07/2015   Influenza vaccination declined by patient 04/07/2015   Pneumococcal vaccination declined by patient 04/07/2015  Immunization not carried out because of patient decision 04/07/2015   Healthcare maintenance 04/07/2015    History obtained from: chart review and patient and her husband .  Laurena Spies was referred by Ileana Ladd, MD.     Shawnique is a 75 y.o. female presenting for an evaluation of a multitude of complaints .  In January 2021, she was helping a friend move and she tripped over a carpet. She fell down and landed on her head. She did not black out as far as she  knows. EMS was called and she went to the hospital. She had issues with balance for 9 months sine that time. She felt better around October 2021. She thought that all of her symptoms of the concussions had gone.   In March 2022, she developed migraines. She was told that the migraines were concussion related. She had never had migraines in the past.    Asthma/Respiratory Symptom History: She has a history of "bronchial asthma". She did have an inhaler at some point in time, maybe 15 years ago. S tried it once or twice but it did not really do much of anything.  She has had a lingering cough since having COVID in 2022. She had it before her concussion as well in early 2021.   Allergic Rhinitis Symptom History: She had allergies as a child and was missing 6-8 weeks of school each year due to these allergist. She went to see a "child specialist" . This was all attributed to a wool blanket. She never had testing performed but she did have "allergy shots" to build her "resistance". This was when she was a very young child. She has reactions when she is around cigarette smoke. She also has issues when leaves are burning. She does not treat it with anything.   [facility] History: She had issues with tongue swelling when she ate gumbo.  She thinks that this may be related to okra that was in the gumbo.  She thinks this is because she has reacted to okra when she is picking it in the garden.  She tends to get a little bit of a rash when that happens.  She has been on it in the past, although not large quantities.  Her husband gets fried okra when they go out to eat sometimes she will pick a couple off of his plate without any problems.  This, also had a lot of seafood in the gumbo including oysters which she has never had.  So she thinks it could be that as well.  She has been avoiding all seafood since that time.  Skin Symptom History: She is wondering about a reaction to nylon. She wore them years ago  without a problem. Apparently there is nylon in the crowns and the dentist is concerned with her having a reaction.   She thinks that her dentist will be able to get a sample of what ever feeling they are going to use.  Otherwise, there is no history of other atopic diseases, including drug allergies, stinging insect allergies, or contact dermatitis. There is no significant infectious history. Vaccinations are up to date.    Past Medical History: Patient Active Problem List   Diagnosis Date Noted   AAA (abdominal aortic aneurysm) without rupture 06/14/2017   Seasonal allergic rhinitis due to pollen 05/08/2017   Heart murmur 05/08/2017   Muscle cramps 05/08/2017   Osteoporosis 04/22/2015   Breast screening declined 04/07/2015   Influenza vaccination declined  by patient 04/07/2015   Pneumococcal vaccination declined by patient 04/07/2015   Immunization not carried out because of patient decision 04/07/2015   Healthcare maintenance 04/07/2015    Medication List:  Allergies as of 12/29/2020       Reactions   Amoxicillin    Eggs Or Egg-derived Products    Erythromycin    Influenza Vaccines    Penicillins    Sulfites         Medication List        Accurate as of December 29, 2020 11:59 PM. If you have any questions, ask your nurse or doctor.          ciprofloxacin 0.3 % ophthalmic solution Commonly known as: Ciloxan 2 drops in the affected eye(s) every 15 minutes for the first 6 hours, then 2 drops every 30 minutes for the remainder of first day; day two, 2 drops every hour; days 3 through 14, two drops every 4 hours   fluticasone 50 MCG/ACT nasal spray Commonly known as: FLONASE Place 2 sprays into both nostrils daily.   ibuprofen 800 MG tablet Commonly known as: ADVIL Take 1 tablet (800 mg total) by mouth every 8 (eight) hours as needed.   traMADol 50 MG tablet Commonly known as: ULTRAM Take 1 tablet (50 mg total) by mouth every 6 (six) hours as needed for severe  pain.        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  She lives in a house that was built in 1992.  There is carpeting throughout the home.  She has a pump for heating and cooling.  There are dogs inside the home.  There are no dust mite covers on the bedding.  There is no tobacco exposure.  She is currently retired but worked as an Retail buyer.  She is not exposed to fumes, chemicals, or dust.  They do not have a HEPA filter, but they do have a UV light per their air system.  They do not live near an interstate or industrial area.   Review of Systems  Constitutional: Negative.  Negative for fever, malaise/fatigue and weight loss.  HENT: Negative.  Negative for congestion, ear discharge and ear pain.   Eyes:  Negative for pain, discharge and redness.  Respiratory:  Negative for cough, sputum production, shortness of breath and wheezing.   Cardiovascular: Negative.  Negative for chest pain and palpitations.  Gastrointestinal:  Negative for abdominal pain, constipation, diarrhea, heartburn, nausea and vomiting.  Skin: Negative.  Negative for itching and rash.  Neurological:  Negative for dizziness and headaches.  Endo/Heme/Allergies:  Negative for environmental allergies. Does not bruise/bleed easily.  All other systems reviewed and are negative.     Objective:   Pulse 87, temperature 97.9 F (36.6 C), height 5' 4.5" (1.638 m), weight 154 lb 9.6 oz (70.1 kg), SpO2 98 %. Body mass index  is 26.13 kg/m.   Physical Exam:  Physical Exam Vitals reviewed.  Constitutional:      Appearance: She is well-developed.     Comments: Very talkative.  HENT:     Head: Normocephalic and atraumatic.     Right Ear: Tympanic membrane, ear canal and external ear normal. No drainage, swelling or tenderness. Tympanic membrane is not injected, scarred, erythematous, retracted or bulging.     Left Ear: Tympanic membrane, ear canal and external ear normal. No drainage, swelling or tenderness. Tympanic membrane is not injected, scarred, erythematous, retracted or bulging.     Nose: No nasal deformity, septal deviation, mucosal edema or rhinorrhea.     Right Turbinates: Enlarged, swollen and pale.     Left Turbinates: Enlarged, swollen and pale.     Right Sinus: No maxillary sinus tenderness or frontal sinus tenderness.     Left Sinus: No maxillary sinus tenderness or frontal sinus tenderness.     Mouth/Throat:     Mouth: Mucous membranes are not pale and not dry.     Pharynx: Uvula midline.  Eyes:     General: Allergic shiner present.        Right eye: No discharge.        Left eye: No discharge.     Conjunctiva/sclera: Conjunctivae normal.     Right eye: Right conjunctiva is not injected. No chemosis.    Left eye: Left conjunctiva is not injected. No chemosis.    Pupils: Pupils are equal, round, and reactive to light.  Cardiovascular:     Rate and Rhythm: Normal rate and regular rhythm.     Heart sounds: Normal heart sounds.  Pulmonary:     Effort: Pulmonary effort is normal. No tachypnea, accessory muscle usage or respiratory distress.     Breath sounds: Normal breath sounds. No wheezing, rhonchi or rales.     Comments: Moving air well in all lung fields.  No increased work of breathing. Chest:     Chest wall: No tenderness.  Abdominal:     Tenderness: There is no abdominal tenderness. There is no guarding or rebound.  Lymphadenopathy:     Head:     Right side of head: No  submandibular, tonsillar or occipital adenopathy.     Left side of head: No submandibular, tonsillar or occipital adenopathy.     Cervical: No cervical adenopathy.  Skin:    General: Skin is warm.     Capillary Refill: Capillary refill takes less than 2 seconds.     Coloration: Skin is not pale.     Findings: No abrasion, erythema, petechiae or rash. Rash is not papular, urticarial or vesicular.     Comments: No rash appreciated.  Neurological:     Mental Status: She is alert.  Psychiatric:        Behavior: Behavior is cooperative.     Diagnostic studies:    Spirometry: results abnormal (FEV1: 1.28/62%, FVC: 1.50/54%, FEV1/FVC: 85%).    Spirometry consistent with possible restrictive disease. Xopenex four puffs via MDI treatment given in clinic with significant improvement in FEV1 and FVC per ATS criteria.  Allergy Studies:     Airborne Adult Perc - 12/29/20 1437     Time Antigen Placed 1437    Allergen Manufacturer Waynette Buttery    Location Back    Number of Test 59    1. Control-Buffer 50% Glycerol Negative    2. Control-Histamine 1 mg/ml 2+    3. Albumin saline Negative    4. Bahia Negative    5. French Southern Territories Negative  6. Johnson Negative    7. Kentucky Blue Negative    8. Meadow Fescue Negative    9. Perennial Rye Negative    10. Sweet Vernal Negative    11. Timothy Negative    12. Cocklebur Negative    13. Burweed Marshelder Negative    14. Ragweed, short Negative    15. Ragweed, Giant Negative    16. Plantain,  English Negative    17. Lamb's Quarters Negative    18. Sheep Sorrell Negative    19. Rough Pigweed Negative    20. Marsh Elder, Rough Negative    21. Mugwort, Common Negative    22. Ash mix Negative    23. Birch mix Negative    24. Beech American Negative    25. Box, Elder Negative    26. Cedar, red Negative    27. Cottonwood, Guinea-Bissau Negative    28. Elm mix Negative    29. Hickory Negative    30. Maple mix Negative    31. Oak, Guinea[place] mix Negative     32. Pecan Pollen Negative    33. Pine mix Negative    34. Sycamore Eastern Negative    35. Walnut, Black Pollen Negative    36. Alternaria alternata 3+    37. Cladosporium Herbarum Negative    38. Aspergillus mix Negative    39. Penicillium mix Negative    40. Bipolaris sorokiniana (Helminthosporium) Negative    41. Drechslera spicifera (Curvularia) Negative    42. Mucor plumbeus Negative    43. Fusarium moniliforme Negative    44. Aureobasidium pullulans (pullulara) Negative    45. Rhizopus oryzae Negative    46. Botrytis cinera Negative    47. Epicoccum nigrum Negative    48. Phoma betae Negative    49. Candida Albicans Negative    50. Trichophyton mentagrophytes Negative    51. Mite, D Farinae  5,000 AU/ml Negative    52. Mite, D Pteronyssinus  5,000 AU/ml Negative    53. Cat Hair 10,000 BAU/ml Negative    54.  Dog Epithelia Negative    55. Mixed Feathers Negative    56. Horse Epithelia Negative    57. Cockroach, German Negative    58. Mouse Negative    59. Tobacco Leaf Negative             Intradermal - 12/29/20 1607     Time Antigen Placed 1530    Allergen Manufacturer Waynette Buttery    Location Arm    Number of Test 14    Intradermal Select    Control Negative    French Southern Territories Negative    Johnson Negative    7 Grass Negative    Ragweed mix Negative    Weed mix Negative    Tree mix Negative    Mold 1 Omitted    Mold 2 Negative    Mold 3 3+    Mold 4 Negative    Cat Negative    Dog Negative    Cockroach Negative    Mite mix Negative             Food Adult Perc - 12/29/20 1500     Time Antigen Placed 1437    Allergen Manufacturer Greer    Location Back    Number of allergen test 14    8. Shellfish Mix Negative    9. Fish Mix Negative    18. Catfish Negative    19. Bass Negative    20. Trout Negative  21. Tuna Negative    22. Salmon Negative    23. Flounder Negative    24. Codfish Negative    25. Shrimp Negative    26. Crab Negative    27. Lobster  Negative    28. Oyster Negative    29. Scallops Negative             Allergy testing results were read and interpreted by myself, documented by clinical staff.         Malachi Bonds, MD [facility]

## 2020-12-29 NOTE — Patient Instructions (Addendum)
1. Mild intermittent asthma, uncomplicated - Lung testing looks slightly low, but it improved with the albuterol treatment. - We are going to start an inhaled steroid to see if this provides any relief of your symptoms (inhaled steroids help with decreasing inflammation as well as decreasing mucus production).  - We are going to send in albuterol to use 2 to 4 puffs every 4-6 hours as needed for shortness of breath/wheezing. - Spacer use reviewed. - Daily controller medication(s): Trelegy 100/62.5/25 one puff once daily - Prior to physical activity: albuterol 2 puffs 10-15 minutes before physical activity. - Rescue medications: albuterol 4 puffs every 4-6 hours as needed - Asthma control goals:  * Full participation in all desired activities (may need albuterol before activity) * Albuterol use two time or less a week on average (not counting use with activity) * Cough interfering with sleep two time or less a month * Oral steroids no more than once a year * No hospitalizations  2. Chronic rhinitis - Testing today showed: outdoor molds. - Copy of test results provided.  - Avoidance measures provided. - Start with: Zyrtec (cetirizine) 10mg  tablet once daily  3. Allergic contact dermatitis - Try to get a sample of that tooth material they will be using at the dentist office. - We can do patch testing on your back to check to see if you have contact dermatitis from this material. - These tests are placed on a Monday and read on a Wednesday and Friday.  4. Anaphylactic shock due to food (seafood, okra) - Testing was negative to the most common foods, including seafood (and oysters). - We are going to get some blood work to confirm this.  - I do not think that we have an okra IgE (allergy) test via the blood. - But we will see what the seafood testing shows first and then if something is positive there, I would recommend introducing okra again at home.   5. Return in about 4 weeks (around  01/26/2021).    Please inform 01/28/2021 of any Emergency Department visits, hospitalizations, or changes in symptoms. Call us before going to the ED for breathing or allergy symptoms since we might be able to fit you in for a sick visit. Feel free to contact us anytime with any questions, problems, or concerns.  It was a pleasure to meet you today!  Websites that have reliable patient information: 1. American Academy of Asthma, Allergy, and Immunology: www.aaaai.org 2. Food Allergy Research and Education (FARE): foodallergy.org 3. Mothers of Asthmatics: http://www.asthmacommunitynetwork.org 4. American College of Allergy, Asthma, and Immunology: www.acaai.org   COVID-19 Vaccine Information can be found at: [place] For questions related to vaccine distribution or appointments, please email [email or web address] or call [phone].   We realize that you might be concerned about having an allergic reaction to the COVID19 vaccines. To help with that concern, WE ARE OFFERING THE COVID19 VACCINES IN OUR OFFICE! Ask the front desk for dates!     Like [phone] on [place] and Instagram for our latest updates!      A healthy democracy works best when [employer] participate! Make sure you are registered to vote! If you have moved or changed any of your contact information, you will need to get this updated before voting!  In some cases, you MAY be able to register to vote online: [employer]      Airborne Adult Perc - 12/29/20 1437     Time Antigen Placed 1437    Allergen Manufacturer 12/31/20  Location Back    Number of Test 59    1. Control-Buffer 50% Glycerol Negative    2. Control-Histamine 1 mg/ml 2+    3. Albumin saline Negative    4. Bahia Negative    5. French Southern Territ[place] Negative    6. Johnson Negative    7. Kentucky Blue Negative    8. Meadow Fescue Negative    9. Perennial Rye Negative     10. Sweet Vernal Negative    11. Timothy Negative    12. Cocklebur Negative    13. Burweed Marshelder Negative    14. Ragweed, short Negative    15. Ragweed, Giant Negative    16. Plantain,  English Negative    17. Lamb's Quarters Negative    18. Sheep Sorrell Negative    19. Rough Pigweed Negative    20. Marsh Elder, Rough Negative    21. Mugwort, Common Negative    22. Ash mix Negative    23. Birch mix Negative    24. Beech American Negative    25. Box, Elder Negative    26. Cedar, red Negative    27. Cottonwood, Guinea-Bissau Negative    28. Elm mix Negative    29. Hickory Negative    30. Maple mix Negative    31. Oak, [place] mix Negative    32. Pecan Pollen Negative    33. Pine mix Negative    34. Sycamore Eastern Negative    35. Walnut, Black Pollen Negative    36. Alternaria alternata 3+    37. Cladosporium Herbarum Negative    38. Aspergillus mix Negative    39. Penicillium mix Negative    40. Bipolaris sorokiniana (Helminthosporium) Negative    41. Drechslera spicifera (Curvularia) Negative    42. Mucor plumbeus Negative    43. Fusarium moniliforme Negative    44. Aureobasidium pullulans (pullulara) Negative    45. Rhizopus oryzae Negative    46. Botrytis cinera Negative    47. Epicoccum nigrum Negative    48. Phoma betae Negative    49. Candida Albicans Negative    50. Trichophyton mentagrophytes Negative    51. Mite, D Farinae  5,000 AU/ml Negative    52. Mite, D Pteronyssinus  5,000 AU/ml Negative    53. Cat Hair 10,000 BAU/ml Negative    54.  Dog Epithelia Negative    55. Mixed Feathers Negative    56. Horse Epithelia Negative    57. Cockroach, German Negative    58. Mouse Negative    59. Tobacco Leaf Negative             Intradermal - 12/29/20 1607     Time Antigen Placed 1530    Allergen Manufacturer Waynette Buttery    Location Arm    Number of Test 14    Intradermal Select    Control Negative    French Southern Territ[place] Negative    Johnson Negative    7 Grass  Negative    Ragweed mix Negative    Weed mix Negative    Tree mix Negative    Mold 1 Omitted    Mold 2 Negative    Mold 3 3+    Mold 4 Negative    Cat Negative    Dog Negative    Cockroach Negative    Mite mix Negative             Food Adult Perc - 12/29/20 1500     Time Antigen Placed 1437    Allergen Manufacturer Waynette Buttery  Location Back    Number of allergen test 14    8. Shellfish Mix Negative    9. Fish Mix Negative    18. Catfish Negative    19. Bass Negative    20. Trout Negative    21. Tuna Negative    22. Salmon Negative    23. Flounder Negative    24. Codfish Negative    25. Shrimp Negative    26. Crab Negative    27. Lobster Negative    28. Oyster Negative    29. Scallops Negative             Control of Mold Allergen   Mold and fungi can grow on a variety of surfaces provided certain temperature and moisture conditions exist.  Outdoor molds grow on plants, decaying vegetation and soil.  The major outdoor mold, Alternaria and Cladosporium, are found in very high numbers during hot and dry conditions.  Generally, a late Summer - Fall peak is seen for common outdoor fungal spores.  Rain will temporarily lower outdoor mold spore count, but counts rise rapidly when the rainy period ends.  The most important indoor molds are Aspergillus and Penicillium.  Dark, humid and poorly ventilated basements are ideal sites for mold growth.  The next most common sites of mold growth are the bathroom and the kitchen.  Outdoor (Seasonal) Mold Control  Positive outdoor molds via skin testing: Alternaria, Bipolaris (Helminthsporium), Drechslera (Curvalaria), and Mucor  Use air conditioning and keep windows closed Avoid exposure to decaying vegetation. Avoid leaf raking. Avoid grain handling. Consider wearing a face mask if working in moldy areas.

## 2020-12-30 ENCOUNTER — Telehealth: Payer: Self-pay | Admitting: Allergy & Immunology

## 2020-12-30 ENCOUNTER — Encounter: Payer: Self-pay | Admitting: Allergy & Immunology

## 2020-12-30 MED ORDER — CETIRIZINE HCL 10 MG PO TABS: 10.0000 mg | ORAL_TABLET | Freq: Every day | ORAL | 5 refills | Status: DC

## 2020-12-30 MED ORDER — ALBUTEROL SULFATE HFA 108 (90 BASE) MCG/ACT IN AERS: 2.0000 | INHALATION_SPRAY | Freq: Four times a day (QID) | RESPIRATORY_TRACT | 2 refills | Status: DC | PRN

## 2020-12-30 MED ORDER — TRELEGY ELLIPTA 100-62.5-25 MCG/ACT IN AEPB: 1.0000 | INHALATION_SPRAY | Freq: Every day | RESPIRATORY_TRACT | 5 refills | Status: DC

## 2020-12-30 NOTE — Telephone Encounter (Signed)
I do not think the Trelegy is going to affect her eyes.  She will continue to use it.  Malachi Bonds, MD [facility]

## 2020-12-30 NOTE — Telephone Encounter (Signed)
Called patient and relayed Dr. Ellouise Newer message. Patient verbalized understanding.   Received message from Dr. Dellis Anes earlier:     Message Received: Today Alfonse Spruce, MD  [facility] Can someone call the patient to schedule her for patch testing?  I think at her follow-up in 4 weeks we could check on her breathing, but she also needs to be scheduled for patch testing.  She is going to be getting a sample of the material from her dentist for patch testing.  We can also do the True Test patches.     Spoke to patient about patch testing. Patient has not scheduled dental surgery yet. Patient is going to have cataract surgery soon - patient finds out the date of cataract surgery this Friday-  and will let us know when she can come in for the patch testing.

## 2020-12-30 NOTE — Telephone Encounter (Signed)
Patient read the instructions that came with the trelegy. The instructions advise pt to tell doctor if she has any eye problems.   Patient did not get to mention to Dr. Dellis Anes that she is having cataract surgery in January - it could be scheduled for first week of January but patient is unsure, she has an appointment this Friday to get the dates of surgery.   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 Patient stated her eyes were watering so badly during the day with the use of the eye drops. She thinks it could have been due to allergies. She was advised to stop using it after the watering of the eyes.   Patient is wanting to know if she should still use the trelegy?   Please advise.   Best contact number: [phone]

## 2020-12-31 NOTE — Telephone Encounter (Signed)
Thanks much!  Trinette Vera, MD [facility]  

## 2021-01-01 LAB — ALLERGY PANEL 19, SEAFOOD GROUP
Allergen Salmon IgE: <0.1 kU/L
Catfish: <0.1 kU/L
Codfish IgE: <0.1 kU/L
F023-IgE Crab: <0.1 kU/L
F080-IgE Lobster: <0.1 kU/L
Shrimp IgE: <0.1 kU/L
Tuna: <0.1 kU/L

## 2021-01-01 LAB — TRYPTASE: Tryptase: 10.2 ug/L (ref 2.2–13.2)

## 2021-02-02 ENCOUNTER — Ambulatory Visit (INDEPENDENT_AMBULATORY_CARE_PROVIDER_SITE_OTHER): Payer: No Typology Code available for payment source | Admitting: Allergy & Immunology

## 2021-02-02 ENCOUNTER — Other Ambulatory Visit: Payer: Self-pay

## 2021-02-02 ENCOUNTER — Encounter: Payer: Self-pay | Admitting: Allergy & Immunology

## 2021-02-02 VITALS — BP 100/70 | HR 86 | Temp 97.8°F | Resp 12 | Ht 64.0 in | Wt 154.0 lb

## 2021-02-02 DIAGNOSIS — J4540 Moderate persistent asthma, uncomplicated: Secondary | ICD-10-CM | POA: Diagnosis not present

## 2021-02-02 DIAGNOSIS — J3089 Other allergic rhinitis: Secondary | ICD-10-CM | POA: Diagnosis not present

## 2021-02-02 DIAGNOSIS — L239 Allergic contact dermatitis, unspecified cause: Secondary | ICD-10-CM

## 2021-02-02 NOTE — Patient Instructions (Addendum)
1. Mild intermittent asthma, uncomplicated - Lung testing looked a bit worse today, but it does make me feel better that you are symptomatically feeling better. - Be sure to use the Trelegy one puff EVERY day to see if this helps.  - I think we are on the right track.  - Daily controller medication(s): Trelegy 100/62.5/25 one puff once daily - Prior to physical activity: albuterol 2 puffs 10-15 minutes before physical activity. - Rescue medications: albuterol 4 puffs every 4-6 hours as needed - Asthma control goals:  * Full participation in all desired activities (may need albuterol before activity) * Albuterol use two time or less a week on average (not counting use with activity) * Cough interfering with sleep two time or less a month * Oral steroids no more than once a year * No hospitalizations  2. Chronic rhinitis (outdoor molds)  - Continue with Zyrtec (cetirizine) 10mg  tablet once daily (or Allergy Clear) - Add on ipratropium on spray per nostril up to twice daily to help dry out the nose.   3. Allergic contact dermatitis - Patch testing placed today. - Come back on Thursday for a reading visit.   4. Anaphylactic shock due to food (seafood, okra) - Continue to avoid avoidance of seafood and okra.  - EpiPen in place today.   5. Return in about 2 days (around 02/04/2021) for [facility] READING and 02/08/2021 for [facility] READ[facility].   Please inform [facility] of any Emergency Department visits, hospitalizations, or changes in symptoms. Call us before going to the ED for breathing or allergy symptoms since we might be able to fit you in for a sick visit. Feel free to contact us anytime with any questions, problems, or concerns.  It was a pleasure to see you again today!  Websites that have reliable patient information: 1. American Academy of Asthma, Allergy, and Immunology: www.aaaai.org 2. Food Allergy Research and Education (FARE): foodallergy.org 3. Mothers of Asthmatics:  http://www.asthmacommunitynetwork.org 4. American College of Allergy, Asthma, and Immunology: www.acaai.org   COVID-19 Vaccine Information can be found at: [place] For questions related to vaccine distribution or appointments, please email [email or web address] or call [phone].   We realize that you might be concerned about having an allergic reaction to the COVID19 vaccines. To help with that concern, WE ARE OFFERING THE COVID19 VACCINES IN OUR OFFICE! Ask the front desk for dates!     Like [phone] on [place] and Instagram for our latest updates!      A healthy democracy works best when [employer] participate! Make sure you are registered to vote! If you have moved or changed any of your contact information, you will need to get this updated before voting!  In some cases, you MAY be able to register to vote online: [employer]

## 2021-02-02 NOTE — Progress Notes (Signed)
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 Mild intermittent asthma, uncomplicated - Lung testing looked a bit worse today, but it does make me feel better that you are symptomatically feeling better. - Be sure to use the Trelegy one puff EVERY day to see if this helps.  - I think we are on the right track.  - Daily controller medication(s): Trelegy 100/62.5/25 one puff once daily - Prior to physical activity: albuterol 2 puffs 10-15 minutes before physical activity. - Rescue medications: albuterol 4 puffs every 4-6 hours as needed - Asthma control goals:  * Full participation in all desired activities (may need albuterol before activity) * Albuterol use two time or less a week on average (not counting use with activity) * Cough interfering with sleep two time or less a month * Oral steroids no more than once a year * No hospitalizations  2. Chronic rhinitis (outdoor molds)  - Continue with Zyrtec (cetirizine) 10mg  tablet once daily (or Allergy Clear) - Add on ipratropium on spray per nostril up to twice daily to help dry out the nose.   3. Allergic contact dermatitis - Patch testing placed today. - Come back on Thursday for a reading visit.   4. Anaphylactic shock due to food (seafood, okra) - Continue to avoid avoidance of seafood and okra.  - EpiPen in place today.   5. Return in about 2 days (around 02/04/2021) for [facility] READING and 02/08/2021 for [facility] READING.    Subjective:   Raven Buchanan is a 76 y.o. female presenting today for follow up of  Chief Complaint  Patient presents with   Other    Testing for dental material that will be used  for her new tooth. She brought in a sample to be tested. Wants to make sure that she isn't allergic to nylon bc that is an ingredient as well.   Asthma    Comes and goes. Chest hurting and sore on the right side today due to the fact that she is out of her Trelegy.     Raven Buchanan has a history of the following: Patient Active Problem List   Diagnosis Date Noted   AAA (abdominal aortic aneurysm) without rupture 06/14/2017   Seasonal allergic rhinitis due to pollen 05/08/2017   Heart murmur 05/08/2017   Muscle cramps 05/08/2017   Osteoporosis 04/22/2015   Breast screening declined 04/07/2015   Influenza vaccination declined by patient 04/07/2015   Pneumococcal vaccination declined by patient 04/07/2015   Immunization not carried out because of patient decision 04/07/2015   Healthcare maintenance 04/07/2015    History obtained from: chart review and patient.  Raven Buchanan is a 76 y.o. female presenting for a follow up visit.  She was last seen in December 2022.  At that time, her lungs testing looked a little bit low, but improved with albuterol.  We decided to start her on Trelegy 1 puff once daily to see if that would help.  For her rhinitis, she was positive to outdoor molds.  We started Zyrtec 10 mg daily.  For her concern of allergic contact dermatitis, we recommended that she get a sample of the tooth  material they were planning on using for the surgery.  For her seafood and okra allergy, we did testing which was negative at the last visit.  We obtain blood work to confirm this.  Seafood panel was completely negative.  Since last visit, she has done well. She did get a sample of this dental filling and brought it with her today.   Asthma/Respiratory Symptom History: She has been using her Trelegy one puff once daily. She has had more energy with this and has been getting out of the bed.   This has been helping significantly. She did not realize that she was having any asthma symptoms until  she started the Trelegy. She has not picked it up from the pharmacy, but instead has been using the sample we gave her.   Allergic Rhinitis Symptom History: It has helped her knowing about the mold allergy. She felt bad when it was raining a lot. She also notes that growing up she had issues when she was in the [place]. She has not required the use of any antibiotics at all since the last visit.   All of her symptoms are now making sense.   Food Allergy Symptom History: She has not been doing any okra or seafood. She is not interested in putting this back into her diet anyway.  She is going to continue to avoid it. She never liked the okra anyway.   Skin Symptom History: She remains interested in patch testing. Evidently, this nylon allergy of hers is not a reaction that she had. Rather, her mother was allergic to nylon which is why she has been avoiding it.   Otherwise, there have been no changes to her past medical history, surgical history, family history, or social history.    Review of Systems  Constitutional: Negative.  Negative for fever, malaise/fatigue and weight loss.  HENT: Negative.  Negative for congestion, ear discharge and ear pain.   Eyes:  Negative for pain, discharge and redness.  Respiratory:  Negative for cough, sputum production, shortness of breath and wheezing.   Cardiovascular: Negative.  Negative for chest pain and palpitations.  Gastrointestinal:  Negative for abdominal pain, constipation, diarrhea, heartburn, nausea and vomiting.  Skin: Negative.  Negative for itching and rash.  Neurological:  Negative for dizziness and headaches.  Endo/Heme/Allergies:  Negative for environmental allergies. Does not bruise/bleed easily.      Objective:   Blood pressure 100/70, pulse 86, temperature 97.8 F (36.6 C), temperature source Temporal, resp. rate 12, height 5\' 4"  (1.626 m), weight 154 lb (69.9 kg), SpO2 97 %. Body mass index is 26.43 kg/m.   Physical  Exam:  Physical Exam Vitals reviewed.  Constitutional:      Appearance: She is well-developed.     Comments: Very talkative.   HENT:     Head: Normocephalic and atraumatic.     Right Ear: Tympanic membrane, ear canal and external ear normal.     Left Ear: Tympanic membrane, ear canal and external ear normal.     Nose: No nasal deformity, septal deviation, mucosal edema or rhinorrhea.     Right Turbinates: Not enlarged or swollen.     Left Turbinates: Not enlarged or swollen.     Right Sinus: No maxillary sinus tenderness or frontal sinus tenderness.     Left Sinus: No maxillary sinus tenderness or frontal sinus tenderness.     Mouth/Throat:     Mouth: Mucous membranes are not pale and not dry.     Pharynx:  Uvula midline.  Eyes:     General: Lids are normal. No allergic shiner.       Right eye: No discharge.        Left eye: No discharge.     Conjunctiva/sclera: Conjunctivae normal.     Right eye: Right conjunctiva is not injected. No chemosis.    Left eye: Left conjunctiva is not injected. No chemosis.    Pupils: Pupils are equal, round, and reactive to light.  Cardiovascular:     Rate and Rhythm: Normal rate and regular rhythm.     Heart sounds: Normal heart sounds.  Pulmonary:     Effort: Pulmonary effort is normal. No tachypnea, accessory muscle usage or respiratory distress.     Breath sounds: Normal breath sounds. No wheezing, rhonchi or rales.  Chest:     Chest wall: No tenderness.  Lymphadenopathy:     Cervical: No cervical adenopathy.  Skin:    Coloration: Skin is not pale.     Findings: No abrasion, erythema, petechiae or rash. Rash is not papular, urticarial or vesicular.  Neurological:     Mental Status: She is alert.  Psychiatric:        Behavior: Behavior is cooperative.     Diagnostic studies:    Spirometry: results abnormal (FEV1: 1.35/67%, FVC: 1.56/58%, FEV1/FVC: 87%).    Spirometry consistent with possible restrictive disease.   Allergy Studies:      T.R.U.E. Test - 02/02/21 1448     Time Antigen Placed 1448    Manufacturer Other   [facility] [employer]   Lot # [id number]    Location Back    Number of Test 37    Reading Interval Day 5    Panel Panel 1;Panel 2;Panel 3    Comments 37) Fake Tooth-            True Test patches placed in combination with the filling sample.      Malachi BondsJoel Emnet Monk, MD  [facility] [place]

## 2021-02-03 MED ORDER — IPRATROPIUM BROMIDE 0.06 % NA SOLN: 1.0000 | Freq: Two times a day (BID) | NASAL | 5 refills | Status: DC | PRN

## 2021-02-03 MED ORDER — EPINEPHRINE 0.3 MG/0.3ML IJ SOAJ: 0.3000 mg | Freq: Once | INTRAMUSCULAR | 1 refills | Status: AC

## 2021-02-04 ENCOUNTER — Other Ambulatory Visit: Payer: Self-pay

## 2021-02-04 ENCOUNTER — Ambulatory Visit: Payer: No Typology Code available for payment source | Admitting: Allergy & Immunology

## 2021-02-04 DIAGNOSIS — R238 Other skin changes: Secondary | ICD-10-CM | POA: Diagnosis not present

## 2021-02-04 DIAGNOSIS — B078 Other viral warts: Secondary | ICD-10-CM | POA: Diagnosis not present

## 2021-02-04 DIAGNOSIS — Z789 Other specified health status: Secondary | ICD-10-CM | POA: Diagnosis not present

## 2021-02-04 DIAGNOSIS — L239 Allergic contact dermatitis, unspecified cause: Secondary | ICD-10-CM

## 2021-02-04 DIAGNOSIS — R208 Other disturbances of skin sensation: Secondary | ICD-10-CM | POA: Diagnosis not present

## 2021-02-04 DIAGNOSIS — L538 Other specified erythematous conditions: Secondary | ICD-10-CM | POA: Diagnosis not present

## 2021-02-04 DIAGNOSIS — L821 Other seborrheic keratosis: Secondary | ICD-10-CM | POA: Diagnosis not present

## 2021-02-04 NOTE — Progress Notes (Signed)
° ° °  Follow-up Note  RE: Raven Buchanan MRN: [id number] DOB: Jan 19, 1945 Date of Office Visit: 02/04/2021  Primary care provider: Vernie Shanks, MD Referring provider: Vernie Shanks, MD   [name] returns to the office today for the initial patch test interpretation, given suspected history of contact dermatitis.    Diagnostics:  48-hour TRUE TEST  reading:  +/- reaction to #2 (Wool Alcohols)  Dental implant sample:   Plan:   Allergic contact dermatitis - The patient has been provided detailed information regarding the substances she is sensitive to, as well as products containing the substances.   - Meticulous avoidance of these substances is recommended.  - If avoidance is not possible, the use of barrier creams or lotions is recommended. - If symptoms persist or progress despite meticulous avoidance of wood alcohols, Dermatology Referral may be warranted.   Salvatore Marvel, MD  [facility] [place]

## 2021-02-07 ENCOUNTER — Encounter: Payer: Self-pay | Admitting: Allergy & Immunology

## 2021-02-07 NOTE — Progress Notes (Signed)
° °  Follow Up Note  RE: [name] MRN: [id number] DOB: [birth year] Date of Office Visit: 02/08/2021  Referring provider: Ileana Ladd, MD Primary care provider: Ileana Ladd, MD  History of Present Illness: I had the pleasure of seeing Raven Buchanan for a follow up visit at the [facility] on 02/08/2021. She is a 76 y.o. female, who is being followed for possible contact dermatitis. Today she is here for final patch test interpretation, given suspected history of contact dermatitis.   Diagnostics:  TRUE TEST final reading:  All negative.  Reviewed images from 02/06/2021 - no erythema noted, picture quality was not the best though.    T.R.U.E. Test - 02/08/21 1400     Time Antigen Placed 1412    Manufacturer Other    Lot # [id number]    Location Back    Number of Test 37    Reading Interval Day 5    Panel Panel 1;Panel 2;Panel 3    1. Nickel Sulfate 0    2. Wool Alcohols 0    3. Neomycin Sulfate 0    4. Potassium Dichromate 0    5. Caine Mix 0    6. Fragrance Mix 0    7. Colophony 0    8. Paraben Mix 0    9. Negative Control 0    10. Balsam of [place] 0    11. Ethylenediamine Dihydrochloride 0    12. Cobalt Dichloride 0    13. p-tert Butylphenol Formaldehyde Resin 0    14. Epoxy Resin 0    15. Carba Mix 0    16.  Black Rubber Mix 0    17. Cl+ Me-Isothiazolinone 0    18. Quaternium-15 0    19. Methyldibromo Glutaronitrile 0    20. p-Phenylenediamine 0    21. Formaldehyde 0    22. Mercapto Mix 0    23. Thimerosal 0    24. Thiuram Mix 0    25. Diazolidinyl Urea 0    26. Quinoline Mix 0    27. Tixocortol-21-Pivalate 0    28. Gold Sodium Thiosulfate 0    29. Imidazolidinyl Urea 0    30. Budesonide 0    31. Hydrocortisone-17-Butyrate 0    32. Mercaptobenzothiazole 0    33. Bacitracin 0    34. Parthenolide 0    35. Disperse Blue 106 0    36. 2-Bromo-2-Nitropropane-1,3-diol 0    Comments 37) Fake Tooth - 0              Assessment and  Plan: Raven Buchanan is a 76 y.o. female with: Allergic contact dermatitis True test: 48 hour reading: +/- to wool alcohol +1 to mercapto mix, +1 to bacitracin. Negative to dental sample.  96 hour reading via picture - negative, picture quality was not ideal though.  Final reading at 144 hour negative to all.  Continue to avoid above items. Safe list emailed to patient.   Return in about 2 months (around 04/08/2021).  It was my pleasure to see Raven Buchanan today and participate in her care. Please feel free to contact me with any questions or concerns.  Sincerely,  Wyline Mood, DO Allergy & Immunology  [facility] office: [phone] [facility] office: [phone] [place] office: [phone]

## 2021-02-08 ENCOUNTER — Encounter: Payer: Self-pay | Admitting: Allergy

## 2021-02-08 ENCOUNTER — Other Ambulatory Visit: Payer: Self-pay

## 2021-02-08 ENCOUNTER — Ambulatory Visit (INDEPENDENT_AMBULATORY_CARE_PROVIDER_SITE_OTHER): Payer: No Typology Code available for payment source | Admitting: Allergy

## 2021-02-08 DIAGNOSIS — L239 Allergic contact dermatitis, unspecified cause: Secondary | ICD-10-CM | POA: Diagnosis not present

## 2021-02-08 NOTE — Assessment & Plan Note (Signed)
True test:  48 hour reading: +/- to wool alcohol +1 to mercapto mix, +1 to bacitracin. Negative to dental sample.   96 hour reading via picture - negative, picture quality was not ideal though.   Final reading at 144 hour negative to all.   Continue to avoid above items.  Safe list emailed to patient.

## 2021-02-08 NOTE — Patient Instructions (Addendum)
48 hour reading: +/- to wool alcohol +1 to mercapto mix, +1 to bacitracin.  Negative to dental sample.   Continue to avoid above items. Safe list will be emailed to you.   Follow up in 2 months with Dr. Dellis Anes.

## 2021-02-10 ENCOUNTER — Telehealth: Payer: Self-pay | Admitting: Allergy

## 2021-02-10 NOTE — Telephone Encounter (Signed)
Patient called to see what bandage she can use that will not break her out. Also she said  that the Trelegy  cost to much and wanted to know if she could use sluticasone proponate-salmetero diskus 250 mg. [facility] [address] [phone].

## 2021-02-10 NOTE — Telephone Encounter (Signed)
Please advise to bandage and change of inhaler

## 2021-02-12 NOTE — Telephone Encounter (Signed)
Per Dr. Dellis Anes patient plans to pick up Trelegy sample on Monday. She will also try using coban bandage wrap with gauze to avoid reaction from regular bandage tape.

## 2021-02-12 NOTE — Telephone Encounter (Signed)
Patient called to check on previous message.

## 2021-02-12 NOTE — Telephone Encounter (Signed)
Patient called to see if she could get another sample of the trelegy. The lowest cost she was able to find it at was $150.    Patient called to see what bandage she can use that will not break her out. She broke out with 3 red spots and is concerned.    Please advise on previous notes

## 2021-02-15 ENCOUNTER — Other Ambulatory Visit: Payer: Self-pay

## 2021-02-15 ENCOUNTER — Telehealth: Payer: Self-pay | Admitting: Allergy & Immunology

## 2021-02-15 MED ORDER — FLUTICASONE FUROATE-VILANTEROL 200-25 MCG/ACT IN AEPB: 1.0000 | INHALATION_SPRAY | Freq: Every day | RESPIRATORY_TRACT | 5 refills | Status: DC

## 2021-02-15 NOTE — Telephone Encounter (Signed)
Safe list was emailed to her again today from the [employer] website.   This comes in a pdf file.

## 2021-02-15 NOTE — Telephone Encounter (Signed)
Patient came into office today and wanted a list of detergent she can use. Was tested for wool and is allergic. She said one was emailed to her, but she lost her list and would like it emailed to [email or web address]

## 2021-02-15 NOTE — Telephone Encounter (Signed)
We can also try sending in Breo one puff daily, Symbicort two puffs once daily, Advair two puffs once daily, or Dulera two puffs once daily. Maybe these will be affordable.   Malachi Bonds, MD [facility] [place]

## 2021-02-15 NOTE — Telephone Encounter (Signed)
I called the patient and she plans to order silicone dryer balls from [facility]. She found some dryer sheets at [employer] that are allergy friendly. Patient picked up the trelegy sample and plans to use that until she is able to get her Memory Dance order from [employer]. It may cost her $25 with the GoodRx coupon. She will call back if the Memory Dance is expensive.

## 2021-02-15 NOTE — Telephone Encounter (Signed)
On Saturday, the patient was doing laundry and used wool balls in her dryer. Wool as a child from what she remembers caused shortness of breath/ coughing asthma flares. She would like to know if the wool/ wool balls could be a trigger for her asthma. I sent in the Breo one puff daily to the [facility]. Patient will call back if it is not affordable.

## 2021-02-15 NOTE — Telephone Encounter (Signed)
I sent her a code message for mychart so she can access the list from Dr. Maudie Mercury

## 2021-02-15 NOTE — Telephone Encounter (Signed)
On Saturday, the patient was doing laundry and used wool balls in her dryer. Wool as a child from what she remembers caused shortness of breath/ coughing asthma flares. She would like to know if the wool/ wool balls could be a trigger for her asthma. I sent in the Breo 200mcg one puff daily to the [facility] pharmacy. Patient will call back if it is not affordable.  °

## 2021-02-15 NOTE — Telephone Encounter (Signed)
She would try not using the wool balls to see if that helps. I think it is a far fetched idea, however.  There are other types of dryer balls she could try, such as silicone.   Salvatore Marvel, MD [facility] [place] [facility] [place]

## 2021-02-16 NOTE — Telephone Encounter (Signed)
Great - thanks!   Denora Wysocki, MD [facility]     

## 2021-03-18 DIAGNOSIS — R208 Other disturbances of skin sensation: Secondary | ICD-10-CM | POA: Diagnosis not present

## 2021-03-18 DIAGNOSIS — B078 Other viral warts: Secondary | ICD-10-CM | POA: Diagnosis not present

## 2021-03-18 DIAGNOSIS — R238 Other skin changes: Secondary | ICD-10-CM | POA: Diagnosis not present

## 2021-03-18 DIAGNOSIS — L298 Other pruritus: Secondary | ICD-10-CM | POA: Diagnosis not present

## 2021-03-18 DIAGNOSIS — L538 Other specified erythematous conditions: Secondary | ICD-10-CM | POA: Diagnosis not present

## 2021-03-18 DIAGNOSIS — Z789 Other specified health status: Secondary | ICD-10-CM | POA: Diagnosis not present

## 2021-04-08 ENCOUNTER — Ambulatory Visit: Payer: No Typology Code available for payment source | Admitting: Allergy & Immunology

## 2021-04-15 DIAGNOSIS — R208 Other disturbances of skin sensation: Secondary | ICD-10-CM | POA: Diagnosis not present

## 2021-04-15 DIAGNOSIS — Z789 Other specified health status: Secondary | ICD-10-CM | POA: Diagnosis not present

## 2021-04-15 DIAGNOSIS — R238 Other skin changes: Secondary | ICD-10-CM | POA: Diagnosis not present

## 2021-04-15 DIAGNOSIS — L298 Other pruritus: Secondary | ICD-10-CM | POA: Diagnosis not present

## 2021-04-15 DIAGNOSIS — B078 Other viral warts: Secondary | ICD-10-CM | POA: Diagnosis not present

## 2021-04-15 DIAGNOSIS — L538 Other specified erythematous conditions: Secondary | ICD-10-CM | POA: Diagnosis not present

## 2021-05-06 ENCOUNTER — Ambulatory Visit: Payer: No Typology Code available for payment source | Admitting: Allergy & Immunology

## 2021-05-25 ENCOUNTER — Ambulatory Visit: Payer: No Typology Code available for payment source | Admitting: Allergy & Immunology

## 2021-05-25 DIAGNOSIS — J309 Allergic rhinitis, unspecified: Secondary | ICD-10-CM

## 2021-06-19 DIAGNOSIS — H524 Presbyopia: Secondary | ICD-10-CM | POA: Diagnosis not present

## 2021-08-18 DIAGNOSIS — Z0100 Encounter for examination of eyes and vision without abnormal findings: Secondary | ICD-10-CM | POA: Diagnosis not present

## 2021-09-08 DIAGNOSIS — L309 Dermatitis, unspecified: Secondary | ICD-10-CM | POA: Diagnosis not present

## 2021-09-08 DIAGNOSIS — B078 Other viral warts: Secondary | ICD-10-CM | POA: Diagnosis not present

## 2021-10-21 DIAGNOSIS — H524 Presbyopia: Secondary | ICD-10-CM | POA: Diagnosis not present

## 2021-10-21 DIAGNOSIS — H5371 Glare sensitivity: Secondary | ICD-10-CM | POA: Diagnosis not present

## 2021-10-21 DIAGNOSIS — H18513 Endothelial corneal dystrophy, bilateral: Secondary | ICD-10-CM | POA: Diagnosis not present

## 2021-10-21 DIAGNOSIS — Z83518 Family history of other specified eye disorder: Secondary | ICD-10-CM | POA: Diagnosis not present

## 2021-10-21 DIAGNOSIS — H52203 Unspecified astigmatism, bilateral: Secondary | ICD-10-CM | POA: Diagnosis not present

## 2021-10-21 DIAGNOSIS — H2513 Age-related nuclear cataract, bilateral: Secondary | ICD-10-CM | POA: Diagnosis not present

## 2021-10-21 DIAGNOSIS — H5213 Myopia, bilateral: Secondary | ICD-10-CM | POA: Diagnosis not present

## 2021-10-24 DIAGNOSIS — N3289 Other specified disorders of bladder: Secondary | ICD-10-CM | POA: Diagnosis not present

## 2021-10-24 DIAGNOSIS — K6389 Other specified diseases of intestine: Secondary | ICD-10-CM | POA: Diagnosis not present

## 2021-10-24 DIAGNOSIS — K5730 Diverticulosis of large intestine without perforation or abscess without bleeding: Secondary | ICD-10-CM | POA: Diagnosis not present

## 2021-10-24 DIAGNOSIS — K5720 Diverticulitis of large intestine with perforation and abscess without bleeding: Secondary | ICD-10-CM | POA: Diagnosis not present

## 2021-10-24 DIAGNOSIS — K8020 Calculus of gallbladder without cholecystitis without obstruction: Secondary | ICD-10-CM | POA: Diagnosis not present

## 2021-10-25 DIAGNOSIS — Z91012 Allergy to eggs: Secondary | ICD-10-CM | POA: Diagnosis not present

## 2021-10-25 DIAGNOSIS — Z880 Allergy status to penicillin: Secondary | ICD-10-CM | POA: Diagnosis not present

## 2021-10-25 DIAGNOSIS — Z882 Allergy status to sulfonamides status: Secondary | ICD-10-CM | POA: Diagnosis not present

## 2021-10-25 DIAGNOSIS — Z887 Allergy status to serum and vaccine status: Secondary | ICD-10-CM | POA: Diagnosis not present

## 2021-10-25 DIAGNOSIS — Z881 Allergy status to other antibiotic agents status: Secondary | ICD-10-CM | POA: Diagnosis not present

## 2021-10-25 DIAGNOSIS — K5720 Diverticulitis of large intestine with perforation and abscess without bleeding: Secondary | ICD-10-CM | POA: Diagnosis not present

## 2021-10-25 DIAGNOSIS — N3289 Other specified disorders of bladder: Secondary | ICD-10-CM | POA: Diagnosis not present

## 2021-10-25 DIAGNOSIS — K6389 Other specified diseases of intestine: Secondary | ICD-10-CM | POA: Diagnosis not present

## 2021-10-25 DIAGNOSIS — Z79899 Other long term (current) drug therapy: Secondary | ICD-10-CM | POA: Diagnosis not present

## 2021-10-25 DIAGNOSIS — K8020 Calculus of gallbladder without cholecystitis without obstruction: Secondary | ICD-10-CM | POA: Diagnosis not present

## 2021-10-25 DIAGNOSIS — R109 Unspecified abdominal pain: Secondary | ICD-10-CM | POA: Diagnosis not present

## 2021-10-25 DIAGNOSIS — K5792 Diverticulitis of intestine, part unspecified, without perforation or abscess without bleeding: Secondary | ICD-10-CM | POA: Diagnosis not present

## 2021-10-25 DIAGNOSIS — K5730 Diverticulosis of large intestine without perforation or abscess without bleeding: Secondary | ICD-10-CM | POA: Diagnosis not present

## 2021-11-05 DIAGNOSIS — K5792 Diverticulitis of intestine, part unspecified, without perforation or abscess without bleeding: Secondary | ICD-10-CM | POA: Diagnosis not present

## 2021-11-05 DIAGNOSIS — Z09 Encounter for follow-up examination after completed treatment for conditions other than malignant neoplasm: Secondary | ICD-10-CM | POA: Diagnosis not present

## 2021-11-24 DIAGNOSIS — H60502 Unspecified acute noninfective otitis externa, left ear: Secondary | ICD-10-CM | POA: Diagnosis not present

## 2021-11-24 DIAGNOSIS — Z6826 Body mass index (BMI) 26.0-26.9, adult: Secondary | ICD-10-CM | POA: Diagnosis not present

## 2021-12-01 DIAGNOSIS — Z6826 Body mass index (BMI) 26.0-26.9, adult: Secondary | ICD-10-CM | POA: Diagnosis not present

## 2021-12-01 DIAGNOSIS — H60502 Unspecified acute noninfective otitis externa, left ear: Secondary | ICD-10-CM | POA: Diagnosis not present

## 2021-12-09 DIAGNOSIS — M25571 Pain in right ankle and joints of right foot: Secondary | ICD-10-CM | POA: Diagnosis not present

## 2021-12-09 DIAGNOSIS — Z6826 Body mass index (BMI) 26.0-26.9, adult: Secondary | ICD-10-CM | POA: Diagnosis not present

## 2021-12-17 DIAGNOSIS — Z8719 Personal history of other diseases of the digestive system: Secondary | ICD-10-CM | POA: Diagnosis not present

## 2021-12-17 DIAGNOSIS — K5730 Diverticulosis of large intestine without perforation or abscess without bleeding: Secondary | ICD-10-CM | POA: Diagnosis not present

## 2021-12-20 ENCOUNTER — Other Ambulatory Visit: Payer: Self-pay | Admitting: Gastroenterology

## 2021-12-20 DIAGNOSIS — E663 Overweight: Secondary | ICD-10-CM | POA: Diagnosis not present

## 2021-12-20 DIAGNOSIS — Z008 Encounter for other general examination: Secondary | ICD-10-CM | POA: Diagnosis not present

## 2021-12-20 DIAGNOSIS — Z8719 Personal history of other diseases of the digestive system: Secondary | ICD-10-CM

## 2021-12-20 DIAGNOSIS — Z6825 Body mass index (BMI) 25.0-25.9, adult: Secondary | ICD-10-CM | POA: Diagnosis not present

## 2021-12-29 DIAGNOSIS — R051 Acute cough: Secondary | ICD-10-CM | POA: Diagnosis not present

## 2021-12-29 DIAGNOSIS — R5383 Other fatigue: Secondary | ICD-10-CM | POA: Diagnosis not present

## 2021-12-29 DIAGNOSIS — R399 Unspecified symptoms and signs involving the genitourinary system: Secondary | ICD-10-CM | POA: Diagnosis not present

## 2021-12-29 DIAGNOSIS — R071 Chest pain on breathing: Secondary | ICD-10-CM | POA: Diagnosis not present

## 2021-12-29 DIAGNOSIS — Z6826 Body mass index (BMI) 26.0-26.9, adult: Secondary | ICD-10-CM | POA: Diagnosis not present

## 2021-12-30 DIAGNOSIS — R051 Acute cough: Secondary | ICD-10-CM | POA: Diagnosis not present

## 2021-12-31 ENCOUNTER — Telehealth: Payer: Self-pay | Admitting: Allergy & Immunology

## 2021-12-31 NOTE — Telephone Encounter (Signed)
Patient is having issues with breathing asking for a call from the nurse for this issue to go over her inhaler instructions with her please advise

## 2021-12-31 NOTE — Telephone Encounter (Signed)
Called patient home and cell phone, after the 3rd call I was able to get patient on the phone. She stated that she went to the doctor yesterday due to having pain around rib cage when taking a deep breath. She stated she was given a breathing treatment and was informed that is was albuterol. Patient remembered how she had some asthma issue last year and had an albuterol inhaler. She said that she found her inhaler and administered 2 puffs and had some relief. Patient wasn't sure if the inhaler was good or not and if she should continue to use it. I informed patient to take the canister out of the base and check the expiration. Patient stated that it expires June of 2024. She also remember she had another inhaler and pulled it out of the cabinet and it was her Trelegy however it expired June 2023. I informed patient not to use the Trelegy however to use the albuterol inhaler 4 puffs every 4-6 hours as needed for wheezing, coughing, shortness of breath and chest pain/tightness. Patient has been schedule for next week 01/06/2022 to see Dr. Dellis Anes regarding this matter. Patient was informed to use the albuterol inhaler until her appointment. Patient verbalized understanding.

## 2022-01-05 NOTE — Telephone Encounter (Signed)
Thank you for the update.  Angeleigh Chiasson, MD [facility] [place]  

## 2022-01-06 ENCOUNTER — Other Ambulatory Visit: Payer: Self-pay

## 2022-01-06 ENCOUNTER — Encounter: Payer: Self-pay | Admitting: Allergy & Immunology

## 2022-01-06 ENCOUNTER — Ambulatory Visit (INDEPENDENT_AMBULATORY_CARE_PROVIDER_SITE_OTHER): Payer: No Typology Code available for payment source | Admitting: Allergy & Immunology

## 2022-01-06 VITALS — BP 104/70 | HR 86 | Temp 97.7°F | Resp 18 | Ht <= 58 in | Wt 154.1 lb

## 2022-01-06 DIAGNOSIS — J4540 Moderate persistent asthma, uncomplicated: Secondary | ICD-10-CM | POA: Diagnosis not present

## 2022-01-06 DIAGNOSIS — J302 Other seasonal allergic rhinitis: Secondary | ICD-10-CM

## 2022-01-06 DIAGNOSIS — J310 Chronic rhinitis: Secondary | ICD-10-CM

## 2022-01-06 MED ORDER — PREDNISONE 10 MG PO TABS: ORAL_TABLET | ORAL | 0 refills | Status: DC

## 2022-01-06 MED ORDER — CETIRIZINE HCL 10 MG PO TABS: 10.0000 mg | ORAL_TABLET | Freq: Every day | ORAL | 5 refills | Status: DC

## 2022-01-06 MED ORDER — ALBUTEROL SULFATE HFA 108 (90 BASE) MCG/ACT IN AERS: 2.0000 | INHALATION_SPRAY | Freq: Four times a day (QID) | RESPIRATORY_TRACT | 1 refills | Status: AC | PRN

## 2022-01-06 MED ORDER — BREZTRI AEROSPHERE 160-9-4.8 MCG/ACT IN AERO: INHALATION_SPRAY | RESPIRATORY_TRACT | 5 refills | Status: DC

## 2022-01-06 MED ORDER — LEVALBUTEROL HCL 1.25 MG/3ML IN NEBU: 1.2500 mg | INHALATION_SOLUTION | RESPIRATORY_TRACT | 1 refills | Status: DC | PRN

## 2022-01-06 NOTE — Patient Instructions (Addendum)
1. Mild intermittent asthma, uncomplicated - Lung testing looks slightly low, but it improved with the albuterol treatment. - I think that you have asthma that is triggered by viral infections. - This is why you only have a problem in the winter for the most part.  - START the prednisone pack that we sent in.  - We are going to send in albuterol neb solution to have on hand if needed.  - We are also going to add on Breztri to use one puff twice daily during the winter season.  - This should be covered better than Breztri.  - Spacer sample and demonstration provided. - Daily controller medication(s):  Breztri one puff twice daily in the WINTER SEASON ONLY - Prior to physical activity: albuterol 2 puffs 10-15 minutes before physical activity. - Rescue medications: albuterol 4 puffs every 4-6 hours as needed - Asthma control goals:  * Full participation in all desired activities (may need albuterol before activity) * Albuterol use two time or less a week on average (not counting use with activity) * Cough interfering with sleep two time or less a month * Oral steroids no more than once a year * No hospitalizations  2. Chronic rhinitis - Previous today showed: outdoor molds. - Copy of test results provided.  - Avoidance measures provided. - Continue with: Zyrtec (cetirizine) 10mg  tablet once daily  3. Allergic contact dermatitis - Continue to avoid your triggering chemicals.   4. Return in about 6 months (around 07/08/2022).    Please inform 07/10/2022 of any Emergency Department visits, hospitalizations, or changes in symptoms. Call us before going to the ED for breathing or allergy symptoms since we might be able to fit you in for a sick visit. Feel free to contact us anytime with any questions, problems, or concerns.  It was a pleasure to meet you today!  Websites that have reliable patient information: 1. American Academy of Asthma, Allergy, and Immunology: www.aaaai.org 2. Food Allergy  Research and Education (FARE): foodallergy.org 3. Mothers of Asthmatics: http://www.asthmacommunitynetwork.org 4. American College of Allergy, Asthma, and Immunology: www.acaai.org   COVID-19 Vaccine Information can be found at: [place] For questions related to vaccine distribution or appointments, please email [email or web address] or call [phone].   We realize that you might be concerned about having an allergic reaction to the COVID19 vaccines. To help with that concern, WE ARE OFFERING THE COVID19 VACCINES IN OUR OFFICE! Ask the front desk for dates!     "Like" [phone] on Facebook and Instagram for our latest updates!      A healthy democracy works best when [place] participate! Make sure you are registered to vote! If you have moved or changed any of your contact information, you will need to get this updated before voting!  In some cases, you MAY be able to register to vote online: [employer]

## 2022-01-06 NOTE — Progress Notes (Signed)
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 Mild intermittent asthma, uncomplicated - Lung testing looks slightly low, but it improved with the albuterol treatment. - I think that you have asthma that is triggered by viral infections. - This is why you only have a problem in the winter for the most part.  - START the prednisone pack that we sent in.  - We are going to send in albuterol neb solution to have on hand if needed.  - We are also going to add on Breztri to use one puff twice daily during the winter season.  - This should be covered better than Breztri.  - Spacer sample and demonstration provided. - Daily controller medication(s):  Breztri one puff twice daily in the WINTER SEASON ONLY - Prior to physical activity: albuterol 2 puffs 10-15 minutes before physical activity. - Rescue medications: albuterol 4 puffs every 4-6 hours as needed - Asthma control goals:  * Full participation in all desired activities (may need albuterol before activity) * Albuterol use two time or less a week on average (not counting use with activity) * Cough interfering with sleep two time or less a month * Oral steroids no more than once a year * No hospitalizations  2. Chronic rhinitis - Previous today showed: outdoor molds. - Copy of test results provided.  - Avoidance measures provided. - Continue with: Zyrtec (cetirizine) 10mg  tablet once daily  3. Allergic contact dermatitis - Continue to avoid your triggering chemicals.   4.  Return in about 6 months (around 07/08/2022).   Subjective:   Raven Buchanan is a 76 y.o. female presenting today for follow up of No chief complaint on file.   Raven Buchanan has a history of the following: Patient Active Problem List   Diagnosis Date Noted   Allergic contact dermatitis 02/08/2021   AAA (abdominal aortic aneurysm) without rupture (HCC) 06/14/2017   Seasonal allergic rhinitis due to pollen 05/08/2017   Heart murmur 05/08/2017   Muscle cramps 05/08/2017   Osteoporosis 04/22/2015   Breast screening declined 04/07/2015   Influenza vaccination declined by patient 04/07/2015   Pneumococcal vaccination declined by patient 04/07/2015   Immunization not carried out because of patient decision 04/07/2015   Healthcare maintenance 04/07/2015    History obtained from: chart review and patient.  Raven Buchanan is a 76 y.o. female presenting for a follow up visit. I last saw her in early 2023. At that time, her lung testing looked a bit worse, but she seemed to be doing better symptomatically with the Trelegy on board.   In the interim, her PCP Dr. 2024 retired. She is now with someone whose name she does not remember.   Asthma/Respiratory Symptom History: She took the Trelegy for another month after I saw her. She stopped it and did fine through spring, summer, and fall. She started having shortness  of breath symptoms 2-3 weeks ago. She did not go to the doctor and did not really feel sick. She went to see her GP to get worked up because she had  family coming in.  She tells me that she had a breathing treatment in the office and did fine for 24-48 hours. She did feel much better. But during the physical exam. She did a deep breath that literally brought tears to her eyes. She did not have wheezing appreciated so her PCP told her that she did NOT have asthma. COVID and other viral testing was negative.   Of note, she did have marked reversibility when I first saw her around one year ago. She  felt symptomatically better with the use of the Trelegy, although I believe that it was rather expensive.   Right now, she does report some tightness on the right side. She did have a CXR but she does not know that results. When she looked at it in the Urgent Care, it looked clear per the patient. She was not put on prednisone and she was not put on antibiotics.   She did have a good Christmas. This is a deep bronchial cough that happens 4-6 times during the day. She has discovered that she was having a sensation that she needed to start coughing. She was not sent home with a nebulizer and medication.   She did have the oral surgery finally. We had previously done patch testing to make sure that she would be able to tolerate it.   Otherwise, there have been no changes to her past medical history, surgical history, family history, or social history.    Review of Systems  Constitutional: Negative.  Negative for chills, fever, malaise/fatigue and weight loss.  HENT: Negative.  Negative for congestion, ear discharge and ear pain.   Eyes:  Negative for pain, discharge and redness.  Respiratory:  Positive for cough and shortness of breath. Negative for sputum production and wheezing.   Cardiovascular: Negative.  Negative for chest pain and palpitations.  Gastrointestinal:  Negative for abdominal pain, constipation, diarrhea, heartburn, nausea and vomiting.  Skin: Negative.  Negative for itching and rash.  Neurological:  Negative for dizziness and headaches.  Endo/Heme/Allergies:  Negative for environmental allergies. Does not bruise/bleed easily.       Objective:   Blood pressure 104/70, pulse 86, temperature 97.7 F (36.5 C), temperature source Temporal, resp. rate 18, height 4' 6.5" (1.384 m), weight 154 lb 1.6 oz (69.9 kg), SpO2 97 %. Body mass index is 36.48 kg/m.    Physical Exam Vitals reviewed.  Constitutional:      Appearance: She is well-developed.     Comments: Very  talkative.   HENT:     Head: Normocephalic and atraumatic.     Right Ear: Tympanic membrane, ear canal and external ear normal.     Left Ear: Tympanic membrane, ear canal and external ear normal.     Nose: No nasal deformity, septal deviation, mucosal edema or rhinorrhea.     Right Turbinates: Not enlarged or swollen.     Left Turbinates: Not enlarged or swollen.     Right Sinus: No maxillary sinus tenderness or frontal sinus tenderness.     Left Sinus: No maxillary sinus tenderness or frontal sinus tenderness.     Mouth/Throat:     Mouth: Mucous membranes are not pale and not dry.     Pharynx: Uvula midline.  Eyes:     General: Lids are normal. No allergic shiner.       Right eye: No discharge.        Left eye: No discharge.     Conjunctiva/sclera: Conjunctivae normal.  Right eye: Right conjunctiva is not injected. No chemosis.    Left eye: Left conjunctiva is not injected. No chemosis.    Pupils: Pupils are equal, round, and reactive to light.  Cardiovascular:     Rate and Rhythm: Normal rate and regular rhythm.     Heart sounds: Normal heart sounds.  Pulmonary:     Effort: Pulmonary effort is normal. No tachypnea, accessory muscle usage or respiratory distress.     Breath sounds: Normal breath sounds. No wheezing, rhonchi or rales.     Comments: Moving air well in all lung fields. No increased work of breathing noted.  Chest:     Chest wall: No tenderness.  Lymphadenopathy:     Cervical: No cervical adenopathy.  Skin:    Coloration: Skin is not pale.     Findings: No abrasion, erythema, petechiae or rash. Rash is not papular, urticarial or vesicular.  Neurological:     Mental Status: She is alert.  Psychiatric:        Behavior: Behavior is cooperative.      Diagnostic studies:    Spirometry: results abnormal (FEV1: 1.321/65%, FVC: 1.73/64%, FEV1/FVC: 76%).    Spirometry consistent with possible restrictive disease. Albuterol four puffs via MDI treatment given in  clinic with significant improvement in FEV1 per ATS criteria. We did start her on prednisone today.   Allergy Studies: none        Raven Bonds, MD  [facility]

## 2022-01-07 ENCOUNTER — Encounter: Payer: Self-pay | Admitting: Allergy & Immunology

## 2022-01-07 ENCOUNTER — Telehealth: Payer: Self-pay | Admitting: Allergy & Immunology

## 2022-01-07 NOTE — Progress Notes (Incomplete)
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 Mild intermittent asthma, uncomplicated - Lung testing looks slightly low, but it improved with the albuterol treatment. - I think that you have asthma that is triggered by viral infections. - This is why you only have a problem in the winter for the most part.  - START the prednisone pack that we sent in.  - We are going to send in albuterol neb solution to have on hand if needed.  - We are also going to add on Breztri to use one puff twice daily during the winter season.  - This should be covered better than Breztri.  - Spacer sample and demonstration provided. - Daily controller medication(s):  Breztri one puff twice daily in the WINTER SEASON ONLY - Prior to physical activity: albuterol 2 puffs 10-15 minutes before physical activity. - Rescue medications: albuterol 4 puffs every 4-6 hours as needed - Asthma control goals:  * Full participation in all desired activities (may need albuterol before activity) * Albuterol use two time or less a week on average (not counting use with activity) * Cough interfering with sleep two time or less a month * Oral steroids no more than once a year * No hospitalizations  2. Chronic rhinitis - Previous today showed: outdoor molds. - Copy of test results provided.  - Avoidance measures provided. - Continue with: Zyrtec (cetirizine) 10mg  tablet once daily  3. Allergic contact dermatitis - Continue to avoid your triggering chemicals.   4.  Return in about 6 months (around 07/08/2022).   Subjective:   Raven Buchanan is a 76 y.o. female presenting today for follow up of No chief complaint on file.   Raven Buchanan has a history of the following: Patient Active Problem List   Diagnosis Date Noted  . Allergic contact dermatitis 02/08/2021  . AAA (abdominal aortic aneurysm) without rupture (HCC) 06/14/2017  . Seasonal allergic rhinitis due to pollen 05/08/2017  . Heart murmur 05/08/2017  . Muscle cramps 05/08/2017  . Osteoporosis 04/22/2015  . Breast screening declined 04/07/2015  . Influenza vaccination declined by patient 04/07/2015  . Pneumococcal vaccination declined by patient 04/07/2015  . Immunization not carried out because of patient decision 04/07/2015  . Healthcare maintenance 04/07/2015    History obtained from: chart review and patient.  Raven Buchanan is a 76 y.o. female presenting for a follow up visit. I last saw her in early 2023. At that time, her lung testing looked a bit worse, but she seemed to be doing better symptomatically with the Trelegy on board.   In the interim, her PCP Dr. 2024 retired. She is now with someone whose name she does not remember.   Asthma/Respiratory Symptom History: She took the Trelegy for another month after I saw her. She stopped it and did fine through spring, summer, and fall. She started having shortness  of breath symptoms 2-3 weeks ago. She did not go to the doctor and did not really feel sick. She went to see her GP to get worked up because she had  family coming in.  She tells me that she had a breathing treatment in the office and did fine for 24-48 hours. She did feel much better. But during the physical exam. She did a deep breath that literally brought tears to her eyes. She did not have wheezing appreciated so her PCP told her that she did NOT have asthma. COVID and other viral testing was negative.   Of note, she did have marked reversibility when I first saw her around one year  ago.   Right now, she does report some tightness on the right side. She did have a CXR but she does not know that results. When she looked at it in the Urgent Care, it looked clear per the patient. She was not put on prednisone and she was not put on antibiotics.   She did have a good Christmas. This is a deep bronchial cough that happens 4-6 times during the day. She has discovered that she was having a sensation that she needed to start coughing. She was not sent home with a nebulizer and medication.   {Blank single:19197::"Allergic Rhinitis Symptom History: ***"," "}  {Blank single:19197::"Food Allergy Symptom History: ***"," "}  {Blank single:19197::"Skin Symptom History: ***"," "}  {Blank single:19197::"GERD Symptom History: ***"," "}  Otherwise, there have been no changes to her past medical history, surgical history, family history, or social history.    ROS     Objective:   Blood pressure 104/70, pulse 86, temperature 97.7 F (36.5 C), temperature source Temporal, resp. rate 18, height 4' 6.5" (1.384 m), weight 154 lb 1.6 oz (69.9 kg), SpO2 97 %. Body mass index is 36.48 kg/m.    Physical Exam   Diagnostic studies:    Spirometry: results abnormal (FEV1: 1.321/65%, FVC: 1.73/64%, FEV1/FVC: 76%).    Spirometry consistent with possible restrictive disease. Albuterol four puffs via MDI treatment given in clinic with significant improvement in FEV1 per ATS criteria.  Allergy Studies: none    {Blank single:19197::"Allergy testing results were read and interpreted by myself, documented by clinical staff."," "}      Raven Bonds, MD  [facility]

## 2022-01-07 NOTE — Telephone Encounter (Signed)
Called patient - DOB verified - advised of provider notation below.  Patient verbalized understanding, no further questions. 

## 2022-01-07 NOTE — Telephone Encounter (Signed)
The albuterol made her shaky. We sent in levalbuterol instead. She should use that one.   Malachi Bonds, MD [facility]

## 2022-01-07 NOTE — Telephone Encounter (Signed)
Patient called and wanted clarification on which medications Dr. Dellis Anes sent in for her. I went through all the prescriptions Dr. Georgia Duff sent in yesterday, 01-06-2022 and how often/the dosage of each.   Patient would like to know which inhaler was changed for her. Patient states she used one of her inhalers and it made her a shaky. Patient would like to know if that is an effect of the new inhaler.   Please advise   Best contact number: [phone]

## 2022-01-11 ENCOUNTER — Telehealth: Payer: Self-pay | Admitting: Allergy & Immunology

## 2022-01-11 NOTE — Telephone Encounter (Signed)
Patient states she was prescribed Nitrofurantoin mono - mcr 100mg  for a staph infection. Patient states she started this medication on Friday 01-07-2022. Patient states she has not started the prednisone yet and is wondering if it is okay to take the prednisone while taking the medication for the staff infection.   Please advise  Best contact number: [phone]

## 2022-01-12 DIAGNOSIS — R300 Dysuria: Secondary | ICD-10-CM | POA: Diagnosis not present

## 2022-01-12 DIAGNOSIS — Z6826 Body mass index (BMI) 26.0-26.9, adult: Secondary | ICD-10-CM | POA: Diagnosis not present

## 2022-01-12 DIAGNOSIS — J4520 Mild intermittent asthma, uncomplicated: Secondary | ICD-10-CM | POA: Diagnosis not present

## 2022-01-12 NOTE — Telephone Encounter (Signed)
Patient states that she will be cleaning and doing things around the house and she notices that she will have some shortness of breath and will realize later that she is panting and has this feeling of "internal shaking". She wanted to know if that was normal or if there is any need for alarm? She stated that she experienced this today and wanted to make you aware that she had not had any caffeine prior.

## 2022-01-12 NOTE — Telephone Encounter (Signed)
Is she using albuterol a lot? That could cause the shaking.   Salvatore Marvel, MD [facility]

## 2022-01-12 NOTE — Telephone Encounter (Signed)
She can take prednisone with that. No problem.   Salvatore Marvel, MD [facility]

## 2022-01-12 NOTE — Telephone Encounter (Signed)
Called to asked the patient Dr. Gillermina Hu question and it rand several times then said call could not be answer and to try again later.

## 2022-01-17 ENCOUNTER — Telehealth: Payer: Self-pay | Admitting: Allergy & Immunology

## 2022-01-17 DIAGNOSIS — M7661 Achilles tendinitis, right leg: Secondary | ICD-10-CM | POA: Diagnosis not present

## 2022-01-17 DIAGNOSIS — M542 Cervicalgia: Secondary | ICD-10-CM | POA: Diagnosis not present

## 2022-01-17 MED ORDER — LEVALBUTEROL HCL 1.25 MG/3ML IN NEBU: 1.2500 mg | INHALATION_SOLUTION | RESPIRATORY_TRACT | 1 refills | Status: AC | PRN

## 2022-01-17 NOTE — Telephone Encounter (Signed)
Pt states pharmacy needs rx for nebulizer meds to be resent.

## 2022-01-17 NOTE — Telephone Encounter (Signed)
Sent in xopenex for pt

## 2022-01-20 NOTE — Telephone Encounter (Addendum)
Called patient - DOB/Pharmacy verified - advised of provider notation.  Patient stated she was using the Albuterol (Ventolin) every 6 hours doing the day only as that's when she's up and about.  Patient advised the Levoalbuterol (Xopenex) 1.25 mg/3 mL nebulizer solution  - Take 1.25 mg by nebulization every 4 hours as needed for wheezing. Dispense: 72 mL RF: 1  was sent in on 01/17/22.  Patient verbalized understanding to all, no further questions.

## 2022-01-21 ENCOUNTER — Inpatient Hospital Stay: Admission: RE | Admit: 2022-01-21 | Payer: Medicare (Managed Care) | Source: Ambulatory Visit

## 2022-01-21 ENCOUNTER — Telehealth: Payer: Self-pay

## 2022-01-21 NOTE — Telephone Encounter (Signed)
Patient called in - DOB verified - wanted to verify/clarify Cetirizine (Zyrtec) were the same medication, was medication cheaper OTC.   Patient advised the medication was the same - Cetirizine is the generic name for Zyrtec. Patient advised she would have to determine which is cheaper for -  purchasing the medication OTC or via prescription.  Patient verbalized understanding, no further questions.

## 2022-02-18 ENCOUNTER — Other Ambulatory Visit: Payer: No Typology Code available for payment source

## 2022-03-22 ENCOUNTER — Other Ambulatory Visit: Payer: No Typology Code available for payment source

## 2022-03-31 DIAGNOSIS — H18513 Endothelial corneal dystrophy, bilateral: Secondary | ICD-10-CM | POA: Diagnosis not present

## 2022-03-31 DIAGNOSIS — H2513 Age-related nuclear cataract, bilateral: Secondary | ICD-10-CM | POA: Diagnosis not present

## 2022-03-31 DIAGNOSIS — H04123 Dry eye syndrome of bilateral lacrimal glands: Secondary | ICD-10-CM | POA: Diagnosis not present

## 2022-03-31 DIAGNOSIS — H25043 Posterior subcapsular polar age-related cataract, bilateral: Secondary | ICD-10-CM | POA: Diagnosis not present

## 2022-03-31 DIAGNOSIS — Z83518 Family history of other specified eye disorder: Secondary | ICD-10-CM | POA: Diagnosis not present

## 2022-03-31 DIAGNOSIS — H43813 Vitreous degeneration, bilateral: Secondary | ICD-10-CM | POA: Diagnosis not present

## 2022-04-21 DIAGNOSIS — Z83518 Family history of other specified eye disorder: Secondary | ICD-10-CM | POA: Diagnosis not present

## 2022-04-21 DIAGNOSIS — H5213 Myopia, bilateral: Secondary | ICD-10-CM | POA: Diagnosis not present

## 2022-04-21 DIAGNOSIS — H2512 Age-related nuclear cataract, left eye: Secondary | ICD-10-CM | POA: Diagnosis not present

## 2022-04-21 DIAGNOSIS — H18513 Endothelial corneal dystrophy, bilateral: Secondary | ICD-10-CM | POA: Diagnosis not present

## 2022-04-26 DIAGNOSIS — I700 Atherosclerosis of aorta: Secondary | ICD-10-CM | POA: Diagnosis not present

## 2022-04-26 DIAGNOSIS — Z0000 Encounter for general adult medical examination without abnormal findings: Secondary | ICD-10-CM | POA: Diagnosis not present

## 2022-04-26 DIAGNOSIS — E673 Hypervitaminosis D: Secondary | ICD-10-CM | POA: Diagnosis not present

## 2022-04-26 DIAGNOSIS — E785 Hyperlipidemia, unspecified: Secondary | ICD-10-CM | POA: Diagnosis not present

## 2022-05-09 DIAGNOSIS — H25812 Combined forms of age-related cataract, left eye: Secondary | ICD-10-CM | POA: Diagnosis not present

## 2022-05-09 DIAGNOSIS — H2512 Age-related nuclear cataract, left eye: Secondary | ICD-10-CM | POA: Diagnosis not present

## 2022-05-10 DIAGNOSIS — Z961 Presence of intraocular lens: Secondary | ICD-10-CM | POA: Diagnosis not present

## 2022-05-17 DIAGNOSIS — H25041 Posterior subcapsular polar age-related cataract, right eye: Secondary | ICD-10-CM | POA: Diagnosis not present

## 2022-05-17 DIAGNOSIS — H18513 Endothelial corneal dystrophy, bilateral: Secondary | ICD-10-CM | POA: Diagnosis not present

## 2022-05-17 DIAGNOSIS — H2511 Age-related nuclear cataract, right eye: Secondary | ICD-10-CM | POA: Diagnosis not present

## 2022-05-23 DIAGNOSIS — H25811 Combined forms of age-related cataract, right eye: Secondary | ICD-10-CM | POA: Diagnosis not present

## 2022-05-23 DIAGNOSIS — H2511 Age-related nuclear cataract, right eye: Secondary | ICD-10-CM | POA: Diagnosis not present

## 2022-05-24 DIAGNOSIS — Z961 Presence of intraocular lens: Secondary | ICD-10-CM | POA: Diagnosis not present

## 2022-05-26 DIAGNOSIS — E785 Hyperlipidemia, unspecified: Secondary | ICD-10-CM | POA: Diagnosis not present

## 2022-05-26 DIAGNOSIS — H18519 Endothelial corneal dystrophy, unspecified eye: Secondary | ICD-10-CM | POA: Diagnosis not present

## 2022-05-26 DIAGNOSIS — H269 Unspecified cataract: Secondary | ICD-10-CM | POA: Diagnosis not present

## 2022-05-26 DIAGNOSIS — Z880 Allergy status to penicillin: Secondary | ICD-10-CM | POA: Diagnosis not present

## 2022-05-26 DIAGNOSIS — Z8249 Family history of ischemic heart disease and other diseases of the circulatory system: Secondary | ICD-10-CM | POA: Diagnosis not present

## 2022-06-07 DIAGNOSIS — Z961 Presence of intraocular lens: Secondary | ICD-10-CM | POA: Diagnosis not present

## 2022-06-07 DIAGNOSIS — H18513 Endothelial corneal dystrophy, bilateral: Secondary | ICD-10-CM | POA: Diagnosis not present

## 2022-06-30 DIAGNOSIS — H18513 Endothelial corneal dystrophy, bilateral: Secondary | ICD-10-CM | POA: Diagnosis not present

## 2022-06-30 DIAGNOSIS — H5213 Myopia, bilateral: Secondary | ICD-10-CM | POA: Diagnosis not present

## 2022-06-30 DIAGNOSIS — Z961 Presence of intraocular lens: Secondary | ICD-10-CM | POA: Diagnosis not present

## 2022-07-07 ENCOUNTER — Ambulatory Visit: Payer: No Typology Code available for payment source | Admitting: Allergy & Immunology

## 2022-08-12 DIAGNOSIS — H5213 Myopia, bilateral: Secondary | ICD-10-CM | POA: Diagnosis not present

## 2022-08-12 DIAGNOSIS — H18513 Endothelial corneal dystrophy, bilateral: Secondary | ICD-10-CM | POA: Diagnosis not present

## 2022-10-11 DIAGNOSIS — M25512 Pain in left shoulder: Secondary | ICD-10-CM | POA: Diagnosis not present

## 2022-10-11 DIAGNOSIS — Z6825 Body mass index (BMI) 25.0-25.9, adult: Secondary | ICD-10-CM | POA: Diagnosis not present

## 2022-12-13 DIAGNOSIS — N952 Postmenopausal atrophic vaginitis: Secondary | ICD-10-CM | POA: Diagnosis not present

## 2022-12-13 DIAGNOSIS — N819 Female genital prolapse, unspecified: Secondary | ICD-10-CM | POA: Diagnosis not present

## 2022-12-13 DIAGNOSIS — N760 Acute vaginitis: Secondary | ICD-10-CM | POA: Diagnosis not present

## 2022-12-13 DIAGNOSIS — R32 Unspecified urinary incontinence: Secondary | ICD-10-CM | POA: Diagnosis not present

## 2022-12-27 DIAGNOSIS — N819 Female genital prolapse, unspecified: Secondary | ICD-10-CM | POA: Diagnosis not present

## 2023-01-09 DIAGNOSIS — K8050 Calculus of bile duct without cholangitis or cholecystitis without obstruction: Secondary | ICD-10-CM | POA: Diagnosis not present

## 2023-01-20 DIAGNOSIS — K8050 Calculus of bile duct without cholangitis or cholecystitis without obstruction: Secondary | ICD-10-CM | POA: Diagnosis not present

## 2023-01-20 DIAGNOSIS — K8020 Calculus of gallbladder without cholecystitis without obstruction: Secondary | ICD-10-CM | POA: Diagnosis not present

## 2023-01-27 DIAGNOSIS — N819 Female genital prolapse, unspecified: Secondary | ICD-10-CM | POA: Diagnosis not present

## 2023-01-27 DIAGNOSIS — M8588 Other specified disorders of bone density and structure, other site: Secondary | ICD-10-CM | POA: Diagnosis not present

## 2023-02-20 DIAGNOSIS — H26493 Other secondary cataract, bilateral: Secondary | ICD-10-CM | POA: Diagnosis not present

## 2023-02-20 DIAGNOSIS — H5213 Myopia, bilateral: Secondary | ICD-10-CM | POA: Diagnosis not present

## 2023-02-20 DIAGNOSIS — H04123 Dry eye syndrome of bilateral lacrimal glands: Secondary | ICD-10-CM | POA: Diagnosis not present

## 2023-02-20 DIAGNOSIS — H02831 Dermatochalasis of right upper eyelid: Secondary | ICD-10-CM | POA: Diagnosis not present

## 2023-02-20 DIAGNOSIS — Z961 Presence of intraocular lens: Secondary | ICD-10-CM | POA: Diagnosis not present

## 2023-02-20 DIAGNOSIS — H18513 Endothelial corneal dystrophy, bilateral: Secondary | ICD-10-CM | POA: Diagnosis not present

## 2023-02-20 DIAGNOSIS — H02834 Dermatochalasis of left upper eyelid: Secondary | ICD-10-CM | POA: Diagnosis not present

## 2023-03-09 DIAGNOSIS — Z03818 Encounter for observation for suspected exposure to other biological agents ruled out: Secondary | ICD-10-CM | POA: Diagnosis not present

## 2023-03-09 DIAGNOSIS — J320 Chronic maxillary sinusitis: Secondary | ICD-10-CM | POA: Diagnosis not present

## 2023-04-03 DIAGNOSIS — N819 Female genital prolapse, unspecified: Secondary | ICD-10-CM | POA: Diagnosis not present

## 2023-04-05 NOTE — Pre-Procedure Instructions (Signed)
 Surgical Instructions   Your procedure is scheduled on Monday, March 31st. Report to [facility] Main Entrance "A" at 05:30 A.M., then check in with the Admitting office. Any questions or running late day of surgery: call [phone]  Questions prior to your surgery date: call [phone], Monday-Friday, 8am-4pm. If you experience any cold or flu symptoms such as cough, fever, chills, shortness of breath, etc. between now and your scheduled surgery, please notify us at the above number.     Remember:  Do not eat or drink after midnight the night before your surgery    Take these medicines the morning of surgery with A SIP OF WATER  May take these medicines IF NEEDED: albuterol (VENTOLIN HFA)- bring inhaler with you on day of surgery loratadine (KLS ALLERCLEAR)    One week prior to surgery, STOP taking any Aspirin (unless otherwise instructed by your surgeon) Aleve, Naproxen, Ibuprofen, Motrin, Advil, Goody's, BC's, all herbal medications, fish oil, and non-prescription vitamins.                     Do NOT Smoke (Tobacco/Vaping) for 24 hours prior to your procedure.  If you use a CPAP at night, you may bring your mask/headgear for your overnight stay.   You will be asked to remove any contacts, glasses, piercing's, hearing aid's, dentures/partials prior to surgery. Please bring cases for these items if needed.    Patients discharged the day of surgery will not be allowed to drive home, and someone needs to stay with them for 24 hours.  SURGICAL WAITING ROOM VISITATION Patients may have no more than 2 support people in the waiting area - these visitors may rotate.   Pre-op nurse will coordinate an appropriate time for 1 ADULT support person, who may not rotate, to accompany patient in pre-op.  Children under the age of 60 must have an adult with them who is not the patient and must remain in the main waiting area with an adult.  If the patient needs to stay at the hospital during  part of their recovery, the visitor guidelines for inpatient rooms apply.  Please refer to the [facility] website for the visitor guidelines for any additional information.   If you received a COVID test during your pre-op visit  it is requested that you wear a mask when out in public, stay away from anyone that may not be feeling well and notify your surgeon if you develop symptoms. If you have been in contact with anyone that has tested positive in the last 10 days please notify you surgeon.      Pre-operative CHG Bathing Instructions   You can play a key role in reducing the risk of infection after surgery. Your skin needs to be as free of germs as possible. You can reduce the number of germs on your skin by washing with CHG (chlorhexidine gluconate) soap before surgery. CHG is an antiseptic soap that kills germs and continues to kill germs even after washing.   DO NOT use if you have an allergy to chlorhexidine/CHG or antibacterial soaps. If your skin becomes reddened or irritated, stop using the CHG and notify one of our RNs at [phone].              TAKE A SHOWER THE NIGHT BEFORE SURGERY AND THE DAY OF SURGERY    Please keep in mind the following:  DO NOT shave, including legs and underarms, 48 hours prior to surgery.   You  may shave your face before/day of surgery.  Place clean sheets on your bed the night before surgery Use a clean washcloth (not used since being washed) for each shower. DO NOT sleep with pet's night before surgery.  CHG Shower Instructions:  Wash your face and private area with normal soap. If you choose to wash your hair, wash first with your normal shampoo.  After you use shampoo/soap, rinse your hair and body thoroughly to remove shampoo/soap residue.  Turn the water OFF and apply half the bottle of CHG soap to a CLEAN washcloth.  Apply CHG soap ONLY FROM YOUR NECK DOWN TO YOUR TOES (washing for 3-5 minutes)  DO NOT use CHG soap on face, private areas,  open wounds, or sores.  Pay special attention to the area where your surgery is being performed.  If you are having back surgery, having someone wash your back for you may be helpful. Wait 2 minutes after CHG soap is applied, then you may rinse off the CHG soap.  Pat dry with a clean towel  Put on clean pajamas    Additional instructions for the day of surgery: DO NOT APPLY any lotions, deodorants, cologne, or perfumes.   Do not wear jewelry or makeup Do not wear nail polish, gel polish, artificial nails, or any other type of covering on natural nails (fingers and toes) Do not bring valuables to the hospital. [facility] is not responsible for valuables/personal belongings. Put on clean/comfortable clothes.  Please brush your teeth.  Ask your nurse before applying any prescription medications to the skin.

## 2023-04-06 ENCOUNTER — Encounter (HOSPITAL_COMMUNITY): Payer: Self-pay

## 2023-04-06 ENCOUNTER — Encounter (HOSPITAL_COMMUNITY)
Admission: RE | Admit: 2023-04-06 | Discharge: 2023-04-06 | Disposition: A | Discharge status: Home / Self Care | Source: Ambulatory Visit | Attending: Obstetrics and Gynecology | Admitting: Obstetrics and Gynecology

## 2023-04-06 ENCOUNTER — Other Ambulatory Visit: Payer: Self-pay

## 2023-04-06 DIAGNOSIS — Z01812 Encounter for preprocedural laboratory examination: Secondary | ICD-10-CM | POA: Insufficient documentation

## 2023-04-06 DIAGNOSIS — N814 Uterovaginal prolapse, unspecified: Secondary | ICD-10-CM | POA: Diagnosis not present

## 2023-04-06 DIAGNOSIS — Z01818 Encounter for other preprocedural examination: Secondary | ICD-10-CM | POA: Diagnosis not present

## 2023-04-06 HISTORY — DX: Cardiac murmur, unspecified: R01.1

## 2023-04-06 HISTORY — DX: Endothelial corneal dystrophy, bilateral: H18.513

## 2023-04-06 LAB — TYPE AND SCREEN
ABO/RH(D): A NEG
Antibody Screen: NEGATIVE

## 2023-04-06 LAB — CBC
HCT: 40.7 % (ref 36.0–46.0)
Hemoglobin: 13.4 g/dL (ref 12.0–15.0)
MCH: 31.7 pg (ref 26.0–34.0)
MCHC: 32.9 g/dL (ref 30.0–36.0)
MCV: 96.2 fL (ref 80.0–100.0)
Platelets: 247 10*3/uL (ref 150–400)
RBC: 4.23 MIL/uL (ref 3.87–5.11)
RDW: 13.1 % (ref 11.5–15.5)
WBC: 4.3 10*3/uL (ref 4.0–10.5)
nRBC: 0 % (ref 0.0–0.2)

## 2023-04-06 NOTE — Progress Notes (Addendum)
 PCP - Jackelyn Poling Cardiologist - denies but did see Dr. Jacinto Halim in 2019 due to heart murmur - requested records  PPM/ICD - denies   Chest x-ray - denies EKG - denies Stress Test - requested ECHO - requested Cardiac Cath - denies  Sleep Study - denies  No DM  Last dose of GLP1 agonist-  n/a GLP1 instructions: n/a  Blood Thinner Instructions:  n/a Aspirin Instructions:  n/a  ERAS Protcol - NPO   COVID TEST- n/a   Anesthesia review: yes - heart murmur  Patient denies shortness of breath, fever, cough and chest pain at PAT appointment   All instructions explained to the patient, with a verbal understanding of the material. Patient agrees to go over the instructions while at home for a better understanding. Patient also instructed to self quarantine after being tested for COVID-19. The opportunity to ask questions was provided.

## 2023-04-10 ENCOUNTER — Other Ambulatory Visit: Payer: Self-pay

## 2023-04-10 ENCOUNTER — Ambulatory Visit (HOSPITAL_COMMUNITY): Payer: Self-pay | Admitting: Medical

## 2023-04-10 ENCOUNTER — Ambulatory Visit (HOSPITAL_COMMUNITY)
Admission: RE | Admit: 2023-04-10 | Discharge: 2023-04-11 | Disposition: A | Discharge status: Home / Self Care | Payer: Self-pay | Attending: Obstetrics and Gynecology | Admitting: Obstetrics and Gynecology

## 2023-04-10 ENCOUNTER — Ambulatory Visit (HOSPITAL_BASED_OUTPATIENT_CLINIC_OR_DEPARTMENT_OTHER): Payer: Self-pay | Admitting: Certified Registered"

## 2023-04-10 ENCOUNTER — Encounter (HOSPITAL_COMMUNITY): Payer: Self-pay | Admitting: Obstetrics and Gynecology

## 2023-04-10 ENCOUNTER — Encounter (HOSPITAL_COMMUNITY)
Admission: RE | Disposition: A | Discharge status: Home / Self Care | Payer: Self-pay | Source: Home / Self Care | Attending: Obstetrics and Gynecology

## 2023-04-10 DIAGNOSIS — N888 Other specified noninflammatory disorders of cervix uteri: Secondary | ICD-10-CM | POA: Diagnosis not present

## 2023-04-10 DIAGNOSIS — N819 Female genital prolapse, unspecified: Secondary | ICD-10-CM | POA: Diagnosis present

## 2023-04-10 DIAGNOSIS — D649 Anemia, unspecified: Secondary | ICD-10-CM | POA: Insufficient documentation

## 2023-04-10 DIAGNOSIS — N813 Complete uterovaginal prolapse: Secondary | ICD-10-CM | POA: Diagnosis not present

## 2023-04-10 DIAGNOSIS — N858 Other specified noninflammatory disorders of uterus: Secondary | ICD-10-CM | POA: Diagnosis not present

## 2023-04-10 DIAGNOSIS — J45909 Unspecified asthma, uncomplicated: Secondary | ICD-10-CM | POA: Diagnosis not present

## 2023-04-10 DIAGNOSIS — N814 Uterovaginal prolapse, unspecified: Secondary | ICD-10-CM

## 2023-04-10 HISTORY — PX: ANTERIOR AND POSTERIOR REPAIR: SHX5121

## 2023-04-10 HISTORY — PX: VAGINAL HYSTERECTOMY: SHX2639

## 2023-04-10 LAB — ABO/RH: ABO/RH(D): A NEG

## 2023-04-10 SURGERY — HYSTERECTOMY, VAGINAL
Anesthesia: General | Site: Vagina

## 2023-04-10 MED ORDER — ACETAMINOPHEN 10 MG/ML IV SOLN
INTRAVENOUS | Status: DC | PRN
Start: 2023-04-10 — End: 2023-04-10
  Administered 2023-04-10: 1000 mg via INTRAVENOUS

## 2023-04-10 MED ORDER — OXYCODONE HCL 5 MG PO TABS: 5.0000 mg | ORAL_TABLET | Freq: Once | ORAL | Status: DC | PRN

## 2023-04-10 MED ORDER — ACETAMINOPHEN 325 MG PO TABS: 325.0000 mg | ORAL_TABLET | ORAL | Status: DC | PRN

## 2023-04-10 MED ORDER — ROCURONIUM BROMIDE 10 MG/ML (PF) SYRINGE
PREFILLED_SYRINGE | INTRAVENOUS | Status: AC
  Filled 2023-04-10: qty 10

## 2023-04-10 MED ORDER — ESTRADIOL 0.1 MG/GM VA CREA
TOPICAL_CREAM | VAGINAL | Status: AC
  Filled 2023-04-10: qty 42.5

## 2023-04-10 MED ORDER — LACTATED RINGERS IV SOLN: INTRAVENOUS | Status: DC

## 2023-04-10 MED ORDER — CHLORHEXIDINE GLUCONATE 0.12 % MT SOLN
15.0000 mL | Freq: Once | OROMUCOSAL | Status: AC
  Administered 2023-04-10: 15 mL via OROMUCOSAL
  Filled 2023-04-10: qty 15

## 2023-04-10 MED ORDER — LIDOCAINE 2% (20 MG/ML) 5 ML SYRINGE
INTRAMUSCULAR | Status: AC
  Filled 2023-04-10: qty 5

## 2023-04-10 MED ORDER — MENTHOL 3 MG MT LOZG
1.0000 | LOZENGE | OROMUCOSAL | Status: DC | PRN
  Administered 2023-04-10 – 2023-04-11 (×2): 3 mg via ORAL
  Filled 2023-04-10 (×2): qty 9

## 2023-04-10 MED ORDER — FENTANYL CITRATE (PF) 100 MCG/2ML IJ SOLN
INTRAMUSCULAR | Status: AC
  Filled 2023-04-10: qty 2

## 2023-04-10 MED ORDER — ACETAMINOPHEN 160 MG/5ML PO SOLN: 325.0000 mg | ORAL | Status: DC | PRN

## 2023-04-10 MED ORDER — ORAL CARE MOUTH RINSE: 15.0000 mL | Freq: Once | OROMUCOSAL | Status: AC

## 2023-04-10 MED ORDER — DEXAMETHASONE SODIUM PHOSPHATE 10 MG/ML IJ SOLN
INTRAMUSCULAR | Status: AC
  Filled 2023-04-10: qty 1

## 2023-04-10 MED ORDER — IBUPROFEN 600 MG PO TABS
600.0000 mg | ORAL_TABLET | Freq: Four times a day (QID) | ORAL | Status: DC
  Administered 2023-04-10 – 2023-04-11 (×4): 600 mg via ORAL
  Filled 2023-04-10 (×4): qty 1

## 2023-04-10 MED ORDER — EPHEDRINE SULFATE-NACL 50-0.9 MG/10ML-% IV SOSY
PREFILLED_SYRINGE | INTRAVENOUS | Status: DC | PRN
  Administered 2023-04-10: 10 mg via INTRAVENOUS

## 2023-04-10 MED ORDER — DOCUSATE SODIUM 100 MG PO CAPS
100.0000 mg | ORAL_CAPSULE | Freq: Two times a day (BID) | ORAL | Status: DC
  Administered 2023-04-10: 100 mg via ORAL
  Filled 2023-04-10: qty 1

## 2023-04-10 MED ORDER — OXYCODONE HCL 5 MG/5ML PO SOLN: 5.0000 mg | Freq: Once | ORAL | Status: DC | PRN

## 2023-04-10 MED ORDER — 0.9 % SODIUM CHLORIDE (POUR BTL) OPTIME
TOPICAL | Status: DC | PRN
  Administered 2023-04-10: 1000 mL

## 2023-04-10 MED ORDER — ONDANSETRON HCL 4 MG/2ML IJ SOLN: 4.0000 mg | Freq: Four times a day (QID) | INTRAMUSCULAR | Status: DC | PRN

## 2023-04-10 MED ORDER — SUGAMMADEX SODIUM 200 MG/2ML IV SOLN
INTRAVENOUS | Status: DC | PRN
  Administered 2023-04-10: 200 mg via INTRAVENOUS

## 2023-04-10 MED ORDER — PROPOFOL 10 MG/ML IV BOLUS
INTRAVENOUS | Status: AC
  Filled 2023-04-10: qty 20

## 2023-04-10 MED ORDER — LIDOCAINE 2% (20 MG/ML) 5 ML SYRINGE
INTRAMUSCULAR | Status: DC | PRN
  Administered 2023-04-10: 60 mg via INTRAVENOUS

## 2023-04-10 MED ORDER — PROPOFOL 10 MG/ML IV BOLUS
INTRAVENOUS | Status: DC | PRN
  Administered 2023-04-10: 100 mg via INTRAVENOUS

## 2023-04-10 MED ORDER — ONDANSETRON HCL 4 MG/2ML IJ SOLN
INTRAMUSCULAR | Status: DC | PRN
Start: 2023-04-10 — End: 2023-04-10
  Administered 2023-04-10: 4 mg via INTRAVENOUS

## 2023-04-10 MED ORDER — FENTANYL CITRATE (PF) 250 MCG/5ML IJ SOLN
INTRAMUSCULAR | Status: DC | PRN
  Administered 2023-04-10: 50 ug via INTRAVENOUS

## 2023-04-10 MED ORDER — SIMETHICONE 80 MG PO CHEW: 80.0000 mg | CHEWABLE_TABLET | Freq: Four times a day (QID) | ORAL | Status: DC | PRN

## 2023-04-10 MED ORDER — ONDANSETRON HCL 4 MG/2ML IJ SOLN
INTRAMUSCULAR | Status: AC
  Filled 2023-04-10: qty 2

## 2023-04-10 MED ORDER — SODIUM CHLORIDE 0.9 % IV SOLN
2.0000 g | INTRAVENOUS | Status: AC
  Administered 2023-04-10: 2 g via INTRAVENOUS
  Filled 2023-04-10: qty 2

## 2023-04-10 MED ORDER — ROCURONIUM BROMIDE 10 MG/ML (PF) SYRINGE
PREFILLED_SYRINGE | INTRAVENOUS | Status: DC | PRN
  Administered 2023-04-10: 50 mg via INTRAVENOUS

## 2023-04-10 MED ORDER — ACETAMINOPHEN 10 MG/ML IV SOLN: 1000.0000 mg | Freq: Once | INTRAVENOUS | Status: DC | PRN

## 2023-04-10 MED ORDER — ACETAMINOPHEN 500 MG PO TABS: 1000.0000 mg | ORAL_TABLET | Freq: Once | ORAL | Status: DC

## 2023-04-10 MED ORDER — BUPIVACAINE-EPINEPHRINE (PF) 0.5% -1:200000 IJ SOLN
INTRAMUSCULAR | Status: AC
  Filled 2023-04-10: qty 30

## 2023-04-10 MED ORDER — ACETAMINOPHEN 325 MG PO TABS
650.0000 mg | ORAL_TABLET | ORAL | Status: DC | PRN
  Administered 2023-04-10 (×2): 650 mg via ORAL
  Filled 2023-04-10 (×2): qty 2

## 2023-04-10 MED ORDER — ESTRADIOL 0.1 MG/GM VA CREA
TOPICAL_CREAM | VAGINAL | Status: DC | PRN
  Administered 2023-04-10: 1 via VAGINAL

## 2023-04-10 MED ORDER — DEXAMETHASONE SODIUM PHOSPHATE 10 MG/ML IJ SOLN
INTRAMUSCULAR | Status: DC | PRN
  Administered 2023-04-10: 5 mg via INTRAVENOUS

## 2023-04-10 MED ORDER — FENTANYL CITRATE (PF) 100 MCG/2ML IJ SOLN
25.0000 ug | INTRAMUSCULAR | Status: DC | PRN
  Administered 2023-04-10 (×2): 25 ug via INTRAVENOUS
  Administered 2023-04-10: 50 ug via INTRAVENOUS

## 2023-04-10 MED ORDER — ONDANSETRON HCL 4 MG PO TABS: 4.0000 mg | ORAL_TABLET | Freq: Four times a day (QID) | ORAL | Status: DC | PRN

## 2023-04-10 MED ORDER — POVIDONE-IODINE 10 % EX SWAB: 2.0000 | Freq: Once | CUTANEOUS | Status: DC

## 2023-04-10 MED ORDER — FENTANYL CITRATE (PF) 250 MCG/5ML IJ SOLN
INTRAMUSCULAR | Status: AC
  Filled 2023-04-10: qty 5

## 2023-04-10 MED ORDER — ACETAMINOPHEN 10 MG/ML IV SOLN
INTRAVENOUS | Status: AC
  Filled 2023-04-10: qty 100

## 2023-04-10 MED ORDER — DROPERIDOL 2.5 MG/ML IJ SOLN: 0.6250 mg | Freq: Once | INTRAMUSCULAR | Status: DC | PRN

## 2023-04-10 SURGICAL SUPPLY — 30 items
BLADE SURG 15 STRL LF DISP TIS (BLADE) ×3 IMPLANT
CANISTER SUCT 3000ML PPV (MISCELLANEOUS) ×1 IMPLANT
GAUZE PACKING FOLDED 2 STR (GAUZE/BANDAGES/DRESSINGS) IMPLANT
GLOVE BIO SURGEON STRL SZ 6.5 (GLOVE) ×1 IMPLANT
GLOVE BIOGEL PI IND STRL 6.5 (GLOVE) ×1 IMPLANT
GLOVE BIOGEL PI IND STRL 7.0 (GLOVE) ×2 IMPLANT
GLOVE SURG UNDER POLY LF SZ7 (GLOVE) ×2 IMPLANT
GOWN STRL REUS W/ TWL LRG LVL3 (GOWN DISPOSABLE) ×4 IMPLANT
KIT TURNOVER KIT B (KITS) ×1 IMPLANT
NDL HYPO 22X1.5 SAFETY MO (MISCELLANEOUS) IMPLANT
NDL SPNL 22GX3.5 QUINCKE BK (NEEDLE) IMPLANT
NEEDLE HYPO 22X1.5 SAFETY MO (MISCELLANEOUS) IMPLANT
NEEDLE SPNL 22GX3.5 QUINCKE BK (NEEDLE) ×1 IMPLANT
NS IRRIG 1000ML POUR BTL (IV SOLUTION) ×1 IMPLANT
PACK VAGINAL WOMENS (CUSTOM PROCEDURE TRAY) ×1 IMPLANT
PAD OB MATERNITY 11 LF (PERSONAL CARE ITEMS) ×1 IMPLANT
PENCIL SMOKE EVACUATOR (MISCELLANEOUS) IMPLANT
SPECIMEN JAR MEDIUM (MISCELLANEOUS) IMPLANT
SPIKE FLUID TRANSFER (MISCELLANEOUS) IMPLANT
SUT PROLENE 1 CT 1 30 (SUTURE) IMPLANT
SUT VIC AB 0 CT1 18XCR BRD8 (SUTURE) ×4 IMPLANT
SUT VIC AB 0 CT1 27XBRD ANBCTR (SUTURE) ×4 IMPLANT
SUT VIC AB 2-0 CT1 18 (SUTURE) IMPLANT
SUT VIC AB 2-0 UR5 27 (SUTURE) ×3 IMPLANT
SUT VIC AB 3-0 SH 27X BRD (SUTURE) IMPLANT
SUT VICRYL 0 TIES 12 18 (SUTURE) ×1 IMPLANT
SUT VICRYL 0 UR6 27IN ABS (SUTURE) IMPLANT
TOWEL GREEN STERILE FF (TOWEL DISPOSABLE) ×2 IMPLANT
TRAY FOLEY W/BAG SLVR 14FR (SET/KITS/TRAYS/PACK) ×1 IMPLANT
UNDERPAD 30X36 HEAVY ABSORB (UNDERPADS AND DIAPERS) ×1 IMPLANT

## 2023-04-10 NOTE — Transfer of Care (Signed)
 Immediate Anesthesia Transfer of Care Note  Patient: Raven Buchanan  Procedure(s) Performed: TOTAL HYSTERECTOMY VAGINAL (Vagina ) ANTERIOR (CYSTOCELE) AND POSTERIOR REPAIR (RECTOCELE) (Vagina )  Patient Location: PACU  Anesthesia Type:General  Level of Consciousness: drowsy and patient cooperative  Airway & Oxygen Therapy: Patient Spontanous Breathing  Post-op Assessment: Report given to RN and Post -op Vital signs reviewed and stable  Post vital signs: Reviewed and stable  Last Vitals:  Vitals Value Taken Time  BP 129/58 04/10/23 0932  Temp    Pulse 69 04/10/23 0935  Resp 10 04/10/23 0935  SpO2 96 % 04/10/23 0935  Vitals shown include unfiled device data.  Last Pain:  Vitals:   04/10/23 0607  PainSc: 0-No pain         Complications: No notable events documented.

## 2023-04-10 NOTE — Brief Op Note (Signed)
 04/10/2023  9:24 AM  PATIENT:  Raven Buchanan  78 y.o. female  PRE-OPERATIVE DIAGNOSIS:   Pelvic organ prolapse   POST-OPERATIVE DIAGNOSIS:  Same  PROCEDURE:  Procedure(s): TOTAL HYSTERECTOMY VAGINAL (N/A) ANTERIOR (CYSTOCELE) AND POSTERIOR REPAIR (RECTOCELE) (N/A)  SURGEON:  Surgeons and Role:    * Marcelle Overlie, MD - Primary    * Richarda Overlie, MD - Assisting  PHYSICIAN ASSISTANT:   ASSISTANTS: none   ANESTHESIA:   general  EBL:  150 mL   BLOOD ADMINISTERED:none  DRAINS: Urinary Catheter (Foley)  and vaginal packing   LOCAL MEDICATIONS USED:  NONE  SPECIMEN:  Source of Specimen:  cervix and uterus  DISPOSITION OF SPECIMEN:  PATHOLOGY  COUNTS:  YES  TOURNIQUET:  * No tourniquets in log *  DICTATION: .Other Dictation: Dictation Number dictated  PLAN OF CARE: Admit for overnight observation  PATIENT DISPOSITION:  PACU - hemodynamically stable.   Delay start of Pharmacological VTE agent (>24hrs) due to surgical blood loss or risk of bleeding: not applicable

## 2023-04-10 NOTE — Anesthesia Postprocedure Evaluation (Signed)
 Anesthesia Post Note  Patient: Raven Buchanan  Procedure(s) Performed: TOTAL HYSTERECTOMY VAGINAL (Vagina ) ANTERIOR (CYSTOCELE) AND POSTERIOR REPAIR (RECTOCELE) (Vagina )     Patient location during evaluation: PACU Anesthesia Type: General Level of consciousness: awake and alert Pain management: pain level controlled Vital Signs Assessment: post-procedure vital signs reviewed and stable Respiratory status: spontaneous breathing, nonlabored ventilation, respiratory function stable and patient connected to nasal cannula oxygen Cardiovascular status: blood pressure returned to baseline and stable Postop Assessment: no apparent nausea or vomiting Anesthetic complications: no  No notable events documented.  Last Vitals:  Vitals:   04/10/23 1032 04/10/23 1121  BP: (!) 143/103 (!) 125/57  Pulse: 72 66  Resp: 15 16  Temp:  36.5 C  SpO2: 98% 98%    Last Pain:  Vitals:   04/10/23 1131  TempSrc:   PainSc: 4                  Shelton Silvas

## 2023-04-10 NOTE — Progress Notes (Signed)
 Pt arrived to 6 north room 3 alert and oriented x4. Pain level 3/10 when moving. Foley in place and hanging below the bladder. Peripad clean dry and intact. Bed in lowest position. Call light in reach. Bed alarm on. Full liquid tray ordered. All needs met at this time.

## 2023-04-10 NOTE — Anesthesia Procedure Notes (Signed)
 Procedure Name: Intubation Date/Time: 04/10/2023 7:50 AM  Performed by: Gus Puma, CRNAPre-anesthesia Checklist: Patient identified, Emergency Drugs available, Suction available and Patient being monitored Patient Re-evaluated:Patient Re-evaluated prior to induction Oxygen Delivery Method: Circle System Utilized Preoxygenation: Pre-oxygenation with 100% oxygen Induction Type: IV induction Ventilation: Mask ventilation without difficulty Laryngoscope Size: Mac and 3 Grade View: Grade II Tube type: Oral Tube size: 7.0 mm Number of attempts: 1 Airway Equipment and Method: Stylet Placement Confirmation: ETT inserted through vocal cords under direct vision, positive ETCO2 and breath sounds checked- equal and bilateral Secured at: 21 cm Tube secured with: Tape Dental Injury: Teeth and Oropharynx as per pre-operative assessment

## 2023-04-10 NOTE — Anesthesia Preprocedure Evaluation (Addendum)
 Anesthesia Evaluation  Patient identified by MRN, date of birth, ID band Patient awake    Reviewed: Allergy & Precautions, NPO status , Patient's Chart, lab work & pertinent test results  Airway Mallampati: III  TM Distance: >3 FB Neck ROM: Full    Dental  (+) Teeth Intact, Dental Advisory Given   Pulmonary asthma    breath sounds clear to auscultation       Cardiovascular + Valvular Problems/Murmurs  Rhythm:Regular Rate:Normal     Neuro/Psych negative neurological ROS  negative psych ROS   GI/Hepatic negative GI ROS, Neg liver ROS,,,  Endo/Other  negative endocrine ROS    Renal/GU Renal disease     Musculoskeletal negative musculoskeletal ROS (+)    Abdominal   Peds  Hematology  (+) Blood dyscrasia, anemia   Anesthesia Other Findings   Reproductive/Obstetrics                             Anesthesia Physical Anesthesia Plan  ASA: 2  Anesthesia Plan: General   Post-op Pain Management: Tylenol PO (pre-op)*   Induction: Intravenous  PONV Risk Score and Plan: 4 or greater and Ondansetron, Dexamethasone and Treatment may vary due to age or medical condition  Airway Management Planned: Oral ETT  Additional Equipment: None  Intra-op Plan:   Post-operative Plan: Extubation in OR  Informed Consent: I have reviewed the patients History and Physical, chart, labs and discussed the procedure including the risks, benefits and alternatives for the proposed anesthesia with the patient or authorized representative who has indicated his/her understanding and acceptance.       Plan Discussed with: CRNA  Anesthesia Plan Comments:        Anesthesia Quick Evaluation

## 2023-04-10 NOTE — Progress Notes (Signed)
 Foley removed. Pt tolerated well with  no issues.

## 2023-04-10 NOTE — Op Note (Unsigned)
 NAMEDEKISHA, MESMER MEDICAL RECORD NO: [id number] ACCOUNT NO: [id number] DATE OF BIRTH: [birth year] FACILITY: [facility] LOCATION: [facility] PHYSICIAN: Lonney Revak L. Vincente Poli, MD  Operative Report   DATE OF PROCEDURE: 04/10/2023  PREOPERATIVE DIAGNOSIS:  Pelvic organ prolapse.  POSTOPERATIVE DIAGNOSIS:  Pelvic organ prolapse.  PROCEDURE:  Total vaginal hysterectomy, McCall cul-de-sac culdoplasty, anterior colporrhaphy, and posterior colporrhaphy.  SURGEON:  Teresa Nicodemus L. Vincente Poli, MD.  ASSISTANT:  Dr. Richarda Overlie.  ESTIMATED BLOOD LOSS: 150 mL.  ANESTHESIA:  General.  PATHOLOGY:  Cervix and uterus.  DRAINS:  Foley catheter and vaginal packing.  COMPLICATIONS:  None.  DESCRIPTION OF PROCEDURE:  The patient was taken to the operating room.  She was intubated.  She was then prepped and draped in the usual sterile fashion.  A timeout was performed.  A Foley catheter was inserted.  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  We initially started with the hysterectomy.  We placed a weighted speculum in the vagina.  The cervix was grasped with a tenaculum and a circumferential incision was made in the  cervix and the posterior cul-de-sac was entered using Metzenbaum scissors.  An anterior cul-de-sac was entered using Metzenbaum scissors.  We then placed a weighted speculum in the posterior cul-de-sac.  Using a curved Heaney clamp, we clamped the  uterosacral cardinal ligament on the right side and then on the left side.  Each pedicle was clamped, cut, and suture ligated using 0 Vicryl suture.  We then walked our way of the broad ligament carefully stained snug beside the cervix and the lower part  of the uterus and each pedicle was clamped, cut, and suture ligated using 0 Vicryl suture.  Once we reached the level of the triple pedicle, the uterus was retroflexed and the remainder of the broad ligament was clamped on either side.  The  pedicles  were secured using suture ligature of 0 Vicryl suture and a free tie of 0 Vicryl suture.  Hemostasis was very good.  We then placed a stitch across the cul-de-sac at the 3 and 9 o'clock positions, a figure-of-eight, and then with one suture placed in the  McCall cul-de-sac suspension to prevent enterocele formation.  We then turned our attention to the anterior vaginal wall.  The cystocele was noted.  We then made a midline incision at the anterior vaginal wall underneath the cystocele and dissected the  overlying vesicovaginal fascia away from the bladder.  We then reduced the cystocele with sharp and blunt dissection and then closed the cystocele with one pursestring suture using 2-0 Vicryl, trimmed the vaginal epithelium, and closed the anterior  vaginal epithelium with a running locked stitch using 2-0 Vicryl suture.  We then closed the posterior vaginal cuff in a running stitch using 0 Vicryl in a running lock stitch.  We then closed the remainder of the vaginal cuff after hemostasis was noted  using 0 Vicryl running lock stitch and tied down the McCall stitch with good suspension of the vaginal cuff.  At this point, we then performed a posterior colporrhaphy.  In the following fascia we made a V-shaped incision at the introitus, made a midline  incision at the posterior vaginal wall, reduced the rectocele using 0 Vicryl interrupted.  The rectocele was very small.  We then trimmed the remainder of the vaginal epithelium and then closed the posterior cuff using a running locked stitch using 2-0  Vicryl.  At the end of the  procedure, hemostasis was very good.  She had correction of the cystocele rectocele and good vaginal vault support.  We then placed one piece of packing with estradiol cream in the vaginal canal to be removed later.  The urine  was draining a normal amount with clear urine.  The patient was extubated.  All sponge, lap, and instrument counts were correct x2.  The patient went  to the recovery room in stable condition.   PUS D: 04/10/2023 9:32:37 am T: 04/10/2023 8:48:00 pm  JOB: [id number]/ [id number]

## 2023-04-10 NOTE — H&P (Signed)
 78 year old female with symptomatic pelvic organ prolapse.  Patient tried pessary.  Past Medical History:  Diagnosis Date   Allergy    Anemia    Asthma    Bladder prolapse, female, acquired 01/10/2013   Seen urology   Chicken pox    Fuchs' corneal dystrophy of both eyes    Heart murmur    Nephrolithiasis 01/11/2007   Osteoporosis 11/26/2013   Prolapsed uterus    Past Surgical History:  Procedure Laterality Date   prolapse uterus  1970   repaired   TONSILLECTOMY AND ADENOIDECTOMY  1949   Scheduled Meds:  acetaminophen  1,000 mg Oral Once   povidone-iodine  2 Application Topical Once   Continuous Infusions:  cefoTEtan (CEFOTAN) IV     lactated ringers     lactated ringers     PRN Meds:. Allergies Amoxicillin, Bacitracin, Egg-derived products, Erythromycin, Influenza a (h1n1) monovalent vaccine, Influenza vaccines, Lanolin, Molds & smuts, Muro's opcon [naphazoline], Other, Penicillins, Shrimp (diagnostic), Sulfa antibiotics, Sulfites, and Wound dressing adhesive Family History  Problem Relation Age of Onset   Arthritis Mother    Diabetes Mother    Heart disease Mother    Coronary artery disease Mother    Diabetes Father    Heart disease Father    CAD Father    Hypertension Brother    Social History   Socioeconomic History   Marital status: Married    Spouse name: Not on file   Number of children: Not on file   Years of education: Not on file   Highest education level: Not on file  Occupational History   Not on file  Tobacco Use   Smoking status: Never   Smokeless tobacco: Never  Vaping Use   Vaping status: Never Used  Substance and Sexual Activity   Alcohol use: No    Comment: rarely   Drug use: No   Sexual activity: Not on file  Other Topics Concern   Not on file  Social History Narrative   Married, Husband Rex. 6 Children Loraine Leriche, Netta [place], [place] and [place]).   MA, Retired.    Denies tobacco, alcohol or recreational drug use.   Occasional  caffeine.    Uses herbal remedies, many holistic medications. Takes a daily vitamin.   Wears her seatbelt, exercises >3x week.    Smoke detector in the home.   Feels safe in her relationship.     Social Drivers of Corporate investment banker Strain: Low Risk  (10/24/2021)   Received from [facility], [facility]   Overall Financial Resource Strain (CARDIA)    Difficulty of Paying Living Expenses: Not hard at all  Food Insecurity: Not on file  Transportation Needs: Not on file  Physical Activity: Not on file  Stress: No Stress Concern Present (10/24/2021)   Received from [facility], [facility]   [employer] of Occupational Health - Occupational Stress Questionnaire    Feeling of Stress : Not at all  Social Connections: Unknown (10/24/2021)   Received from [facility], [facility]   Social Network    Social Network: Not on file   BP (!) 140/73   Pulse 73   Temp 97.8 F (36.6 C)   Resp 18   Ht 5\' 4"  (1.626 m)   Wt 68.9 kg   SpO2 95%   BMI 26.09 kg/m  No results found for this or any previous visit (from the past 24 hours). General alert and oriented Lung CTAB  Car RRR  Abdomen is soft and non tender  Pelvic  Cervix is Grade 3 cervicouterine prolapse  Cystocele and small rectocele  IMPRESSION: Pelvic Organ Prolapse  PLAN: Proceed with TVH, Anterior and posterior repair  Risks are reviewed Consent is signed

## 2023-04-10 NOTE — Progress Notes (Signed)
 Patient is comfortable BP (!) 125/57 (BP Location: Left Arm)   Pulse 66   Temp 97.7 F (36.5 C) (Oral)   Resp 16   Ht 5\' 4"  (1.626 m)   Wt 68.9 kg   SpO2 98%   BMI 26.09 kg/m  Results for orders placed or performed during the hospital encounter of 04/10/23 (from the past 24 hours)  ABO/Rh     Status: None   Collection Time: 04/10/23  7:27 AM  Result Value Ref Range   ABO/RH(D)      A NEG Performed at [facility], [address] [place], [place] [address]    Urine output is good Vaginal packing is removed  Remove foley Ambulate  Advance diet  Discharge home tomorrow

## 2023-04-11 ENCOUNTER — Encounter (HOSPITAL_COMMUNITY): Payer: Self-pay | Admitting: Obstetrics and Gynecology

## 2023-04-11 DIAGNOSIS — D649 Anemia, unspecified: Secondary | ICD-10-CM | POA: Diagnosis not present

## 2023-04-11 DIAGNOSIS — J45909 Unspecified asthma, uncomplicated: Secondary | ICD-10-CM | POA: Diagnosis not present

## 2023-04-11 DIAGNOSIS — N819 Female genital prolapse, unspecified: Secondary | ICD-10-CM | POA: Diagnosis not present

## 2023-04-11 DIAGNOSIS — N813 Complete uterovaginal prolapse: Secondary | ICD-10-CM | POA: Diagnosis not present

## 2023-04-11 LAB — CBC
HCT: 28.8 % — ABNORMAL LOW (ref 36.0–46.0)
Hemoglobin: 9.6 g/dL — ABNORMAL LOW (ref 12.0–15.0)
MCH: 31.2 pg (ref 26.0–34.0)
MCHC: 33.3 g/dL (ref 30.0–36.0)
MCV: 93.5 fL (ref 80.0–100.0)
Platelets: 199 10*3/uL (ref 150–400)
RBC: 3.08 MIL/uL — ABNORMAL LOW (ref 3.87–5.11)
RDW: 13.2 % (ref 11.5–15.5)
WBC: 8.8 10*3/uL (ref 4.0–10.5)
nRBC: 0 % (ref 0.0–0.2)

## 2023-04-11 LAB — SURGICAL PATHOLOGY

## 2023-04-11 NOTE — Plan of Care (Signed)
  Problem: Clinical Measurements: Goal: Ability to maintain clinical measurements within normal limits will improve Outcome: Progressing   Problem: Health Behavior/Discharge Planning: Goal: Ability to manage health-related needs will improve Outcome: Progressing   

## 2023-04-11 NOTE — Progress Notes (Signed)
 AVS printed and reviewed with patient IV removed and pressure applied.  All questions answered  ride is on the way patient is getting ready.  See LDA for surgical incision being discharge with.

## 2023-04-11 NOTE — Discharge Summary (Signed)
 Admission Diagnosis: Pelvic Organ Prolapse  Discharge Diagnosis: Same  Hospital Course: 78 year old female with pelvic organ prolapse. On Day of admission underwent uncomplicated TVH and Anterior and posterior repair. She did very well postop . Foley and vaginal packing removed on POD #0. By POD # 1 she was ambulating, voiding, tolerating regular diet and had minimal pain.  BP 128/61 (BP Location: Left Arm)   Pulse 79   Temp 98.1 F (36.7 C) (Oral)   Resp 18   Ht 5\' 4"  (1.626 m)   Wt 68.9 kg   SpO2 97%   BMI 26.09 kg/m  No results found for this or any previous visit (from the past 24 hours).  General alert and oriented Lung CTAB Car RRR Abdomen is soft and non tender  No vb  Patient discharged home in good condition Follow up in 1 week No driving No intercourse No heavy lifting Other post op precautions reviewed with patient

## 2023-05-23 DIAGNOSIS — K080 Exfoliation of teeth due to systemic causes: Secondary | ICD-10-CM | POA: Diagnosis not present

## 2023-06-01 DIAGNOSIS — K080 Exfoliation of teeth due to systemic causes: Secondary | ICD-10-CM | POA: Diagnosis not present

## 2023-06-19 DIAGNOSIS — E559 Vitamin D deficiency, unspecified: Secondary | ICD-10-CM | POA: Diagnosis not present

## 2023-06-19 DIAGNOSIS — E063 Autoimmune thyroiditis: Secondary | ICD-10-CM | POA: Diagnosis not present

## 2023-06-19 DIAGNOSIS — Z8249 Family history of ischemic heart disease and other diseases of the circulatory system: Secondary | ICD-10-CM | POA: Diagnosis not present

## 2023-06-19 DIAGNOSIS — Z79890 Hormone replacement therapy (postmenopausal): Secondary | ICD-10-CM | POA: Diagnosis not present

## 2023-07-10 DIAGNOSIS — E7800 Pure hypercholesterolemia, unspecified: Secondary | ICD-10-CM | POA: Diagnosis not present

## 2023-07-10 DIAGNOSIS — E785 Hyperlipidemia, unspecified: Secondary | ICD-10-CM | POA: Diagnosis not present

## 2023-07-25 DIAGNOSIS — Z0000 Encounter for general adult medical examination without abnormal findings: Secondary | ICD-10-CM | POA: Diagnosis not present

## 2023-07-27 DIAGNOSIS — H04123 Dry eye syndrome of bilateral lacrimal glands: Secondary | ICD-10-CM | POA: Diagnosis not present

## 2023-07-27 DIAGNOSIS — H26493 Other secondary cataract, bilateral: Secondary | ICD-10-CM | POA: Diagnosis not present

## 2023-07-27 DIAGNOSIS — Z961 Presence of intraocular lens: Secondary | ICD-10-CM | POA: Diagnosis not present

## 2023-07-27 DIAGNOSIS — H02834 Dermatochalasis of left upper eyelid: Secondary | ICD-10-CM | POA: Diagnosis not present

## 2023-07-27 DIAGNOSIS — H5213 Myopia, bilateral: Secondary | ICD-10-CM | POA: Diagnosis not present

## 2023-07-27 DIAGNOSIS — H02831 Dermatochalasis of right upper eyelid: Secondary | ICD-10-CM | POA: Diagnosis not present

## 2023-07-27 DIAGNOSIS — H18513 Endothelial corneal dystrophy, bilateral: Secondary | ICD-10-CM | POA: Diagnosis not present

## 2023-08-10 DIAGNOSIS — E7800 Pure hypercholesterolemia, unspecified: Secondary | ICD-10-CM | POA: Diagnosis not present

## 2023-08-10 DIAGNOSIS — E785 Hyperlipidemia, unspecified: Secondary | ICD-10-CM | POA: Diagnosis not present

## 2023-08-31 DIAGNOSIS — Z4889 Encounter for other specified surgical aftercare: Secondary | ICD-10-CM | POA: Diagnosis not present

## 2023-09-10 DIAGNOSIS — E785 Hyperlipidemia, unspecified: Secondary | ICD-10-CM | POA: Diagnosis not present

## 2023-09-10 DIAGNOSIS — E7800 Pure hypercholesterolemia, unspecified: Secondary | ICD-10-CM | POA: Diagnosis not present

## 2023-10-10 DIAGNOSIS — E785 Hyperlipidemia, unspecified: Secondary | ICD-10-CM | POA: Diagnosis not present

## 2023-10-10 DIAGNOSIS — E7800 Pure hypercholesterolemia, unspecified: Secondary | ICD-10-CM | POA: Diagnosis not present

## 2023-10-16 DIAGNOSIS — K080 Exfoliation of teeth due to systemic causes: Secondary | ICD-10-CM | POA: Diagnosis not present

## 2023-11-03 DIAGNOSIS — Z6827 Body mass index (BMI) 27.0-27.9, adult: Secondary | ICD-10-CM | POA: Diagnosis not present

## 2023-11-06 DIAGNOSIS — L814 Other melanin hyperpigmentation: Secondary | ICD-10-CM | POA: Diagnosis not present

## 2023-11-06 DIAGNOSIS — L570 Actinic keratosis: Secondary | ICD-10-CM | POA: Diagnosis not present

## 2023-11-06 DIAGNOSIS — L821 Other seborrheic keratosis: Secondary | ICD-10-CM | POA: Diagnosis not present

## 2023-11-06 DIAGNOSIS — D225 Melanocytic nevi of trunk: Secondary | ICD-10-CM | POA: Diagnosis not present

## 2023-11-06 DIAGNOSIS — L300 Nummular dermatitis: Secondary | ICD-10-CM | POA: Diagnosis not present

## 2023-11-06 DIAGNOSIS — D485 Neoplasm of uncertain behavior of skin: Secondary | ICD-10-CM | POA: Diagnosis not present

## 2023-11-10 DIAGNOSIS — E7800 Pure hypercholesterolemia, unspecified: Secondary | ICD-10-CM | POA: Diagnosis not present

## 2023-11-10 DIAGNOSIS — E785 Hyperlipidemia, unspecified: Secondary | ICD-10-CM | POA: Diagnosis not present

## 2023-11-22 DIAGNOSIS — H40003 Preglaucoma, unspecified, bilateral: Secondary | ICD-10-CM | POA: Diagnosis not present

## 2023-11-22 DIAGNOSIS — H04123 Dry eye syndrome of bilateral lacrimal glands: Secondary | ICD-10-CM | POA: Diagnosis not present

## 2023-11-22 DIAGNOSIS — H18513 Endothelial corneal dystrophy, bilateral: Secondary | ICD-10-CM | POA: Diagnosis not present

## 2023-11-22 DIAGNOSIS — H26493 Other secondary cataract, bilateral: Secondary | ICD-10-CM | POA: Diagnosis not present

## 2023-11-22 DIAGNOSIS — H02831 Dermatochalasis of right upper eyelid: Secondary | ICD-10-CM | POA: Diagnosis not present

## 2023-12-10 DIAGNOSIS — E7800 Pure hypercholesterolemia, unspecified: Secondary | ICD-10-CM | POA: Diagnosis not present

## 2023-12-10 DIAGNOSIS — E785 Hyperlipidemia, unspecified: Secondary | ICD-10-CM | POA: Diagnosis not present
